# Patient Record
Sex: Male | Born: 1975 | Hispanic: No | Marital: Married | Smoking: Current every day smoker
Health system: Southern US, Community
[De-identification: ages and names within clinical notes are randomized; demographics above are authoritative.]

## PROBLEM LIST (undated history)

## (undated) DIAGNOSIS — Z789 Other specified health status: Secondary | ICD-10-CM

## (undated) HISTORY — PX: NO PAST SURGERIES: SHX2092

---

## 2000-02-28 ENCOUNTER — Emergency Department (HOSPITAL_COMMUNITY): Admission: EM | Admit: 2000-02-28 | Discharge: 2000-02-28 | Payer: Self-pay | Admitting: *Deleted

## 2001-05-20 ENCOUNTER — Encounter: Payer: Self-pay | Admitting: Emergency Medicine

## 2001-05-20 ENCOUNTER — Emergency Department (HOSPITAL_COMMUNITY): Admission: EM | Admit: 2001-05-20 | Discharge: 2001-05-20 | Payer: Self-pay | Admitting: Emergency Medicine

## 2009-06-10 ENCOUNTER — Emergency Department (HOSPITAL_COMMUNITY): Admission: EM | Admit: 2009-06-10 | Discharge: 2009-06-11 | Payer: Self-pay | Admitting: Emergency Medicine

## 2014-10-22 ENCOUNTER — Encounter (HOSPITAL_COMMUNITY): Payer: Self-pay | Admitting: *Deleted

## 2014-10-22 ENCOUNTER — Emergency Department (HOSPITAL_COMMUNITY)
Admission: EM | Admit: 2014-10-22 | Discharge: 2014-10-22 | Disposition: A | Discharge status: Home / Self Care | Payer: Self-pay | Attending: Emergency Medicine | Admitting: Emergency Medicine

## 2014-10-22 DIAGNOSIS — Z720 Tobacco use: Secondary | ICD-10-CM | POA: Insufficient documentation

## 2014-10-22 DIAGNOSIS — M79602 Pain in left arm: Secondary | ICD-10-CM | POA: Insufficient documentation

## 2014-10-22 LAB — CBG MONITORING, ED: Glucose-Capillary: 82 mg/dL (ref 65–99)

## 2014-10-22 MED ORDER — CYCLOBENZAPRINE HCL 5 MG PO TABS: 5.0000 mg | ORAL_TABLET | Freq: Two times a day (BID) | ORAL | Status: DC | PRN

## 2014-10-22 NOTE — Discharge Instructions (Signed)

## 2014-10-22 NOTE — ED Notes (Signed)
Pt is in stable condition upon d/c and ambulates from ED. 

## 2014-10-22 NOTE — ED Notes (Signed)
CBG 83, Tiffany PA informed

## 2014-10-22 NOTE — ED Provider Notes (Signed)
CSN: [id number]     Arrival date & time 10/22/14  [id number] History   First MD Initiated Contact with Patient 10/22/14 [phone]     Chief Complaint  Patient presents with  . Arm Pain     (Consider location/radiation/quality/duration/timing/severity/associated sxs/prior Treatment) HPI  PCP: No primary care provider on file. Blood pressure 119/99, pulse 72, temperature 97.9 F (36.6 C), temperature source Oral, height 5\' 11"  (1.803 m), weight 175 lb (79.379 kg), SpO2 100 %.  Thompson LouisCorrie Thompson is a 39 y.o.male without any significant PMH presents to the ER with complaints of left arm pain since Saturday. He denies having any injury or doing any physical activity that would cause the pain. He works at [employer] but says he is right handed. The pain does from the mid bicep down to his the middle of his hand, anterior ly and posteriorly.  He has not noticed any rash, wounds, swelling, weakness, or numbness. The pain is throbbing. He has not tried any medication at home for the pain. He describes the pain as very mild currently. Nothing makes it better or worse.    History reviewed. No pertinent past medical history. History reviewed. No pertinent past surgical history. Family History  Problem Relation Age of Onset  . Diabetes Mother   . Hypertension Mother   . Migraines Mother    History  Substance Use Topics  . Smoking status: Current Every Day Smoker -- 0.50 packs/day    Types: Cigarettes  . Smokeless tobacco: Never Used  . Alcohol Use: Yes     Comment: socially    Review of Systems  10 Systems reviewed and are negative for acute change except as noted in the HPI.    Allergies  Review of patient's allergies indicates no known allergies.  Home Medications   Prior to Admission medications   Medication Sig Start Date End Date Taking? Authorizing Provider  ibuprofen (ADVIL,MOTRIN) 400 MG tablet Take 400 mg by mouth every 6 (six) hours as needed for mild pain.   Yes Historical Provider, MD   cyclobenzaprine (FLEXERIL) 5 MG tablet Take 1 tablet (5 mg total) by mouth 2 (two) times daily as needed for muscle spasms. 10/22/14   Helmer Dull Neva SeatGreene, PA-C   BP 126/85 mmHg  Pulse 66  Temp(Src) 97.9 F (36.6 C) (Oral)  Ht 5\' 11"  (1.803 m)  Wt 175 lb (79.379 kg)  BMI 24.42 kg/m2  SpO2 100% Physical Exam  Constitutional: He appears well-developed and well-nourished. No distress.  HENT:  Head: Normocephalic and atraumatic.  Eyes: Pupils are equal, round, and reactive to light.  Neck: Normal range of motion. Neck supple.  Cardiovascular: Normal rate and regular rhythm.   Pulmonary/Chest: Effort normal.  Abdominal: Soft.  Musculoskeletal:       Left elbow: He exhibits normal range of motion, no swelling, no effusion, no deformity and no laceration. No tenderness found. No radial head, no medial epicondyle, no lateral epicondyle and no olecranon process tenderness noted.  No abnormality on physical exam. The patient has FROM active and passive without pain. He has a negative Tinel's and Phalen's sign. His radial pulses are symmetrical, he has intact sensation to all 5 fingers with CR < 2 seconds to all 5. NO neck or shoulder pain or abnormalities. No rash, skin is pink and moist.  Neurological: He is alert.  Skin: Skin is warm and dry.  Nursing note and vitals reviewed.   ED Course  Procedures (including critical care time) Labs Review Labs Reviewed  CBG  MONITORING, ED    Imaging Review No results found.   EKG Interpretation None     Thompson LouisSLOAN, Helix QM:[id number] 22-Oct-2014 10:23:16 [facility] ROUTINE RECORD Sinus rhythm  early repol pattern 2925mm/s 4210mm/mV 100Hz  8.0 SP2 CID: [id number] Referred by: Unconfirmed Vent. rate 59 BPM PR interval 170 ms QRS duration 83 ms QT/QTc 428/424 ms P-R-T axes 49 84 60  MDM   Final diagnoses:  Arm pain, diffuse, left   Pain appears to be peripheral neuropathy. CBG is WNL and EKG shows no significant abnormalities to  suggest cardiac etiology.  Will rx flexeril and recommend NSAIDs for pain control. Referral to Ortho, rest and ice shoulder/neck.  39 y.o.[name]'s evaluation in the Emergency Department is complete. It has been determined that no acute conditions requiring further emergency intervention are present at this time. The patient/guardian have been advised of the diagnosis and plan. We have discussed signs and symptoms that warrant return to the ED, such as changes or worsening in symptoms.  Vital signs are stable at discharge. Filed Vitals:   10/22/14 1000  BP: 126/85  Pulse: 66  Temp:     Patient/guardian has voiced understanding and agreed to follow-up with the PCP or specialist.     Marlon Peliffany Abir Craine, PA-C 10/22/14 1035  Toy CookeyMegan Docherty, MD 10/22/14 2053

## 2014-10-22 NOTE — ED Notes (Signed)
Pt arrives from home via POV. Pt states he began having left arm since Saturday 10/17/14 and states it hasn't gotten any worse or better. Pt states he is having pain from his left shoulder to his fingertips. Pt states nothing makes it worse or better, just a constant throbbing pain.

## 2016-03-03 ENCOUNTER — Emergency Department (HOSPITAL_COMMUNITY)
Admission: EM | Admit: 2016-03-03 | Discharge: 2016-03-03 | Disposition: A | Discharge status: Home / Self Care | Payer: PRIVATE HEALTH INSURANCE | Attending: Emergency Medicine | Admitting: Emergency Medicine

## 2016-03-03 ENCOUNTER — Encounter (HOSPITAL_COMMUNITY): Payer: Self-pay | Admitting: Emergency Medicine

## 2016-03-03 DIAGNOSIS — F17210 Nicotine dependence, cigarettes, uncomplicated: Secondary | ICD-10-CM | POA: Insufficient documentation

## 2016-03-03 DIAGNOSIS — K1379 Other lesions of oral mucosa: Secondary | ICD-10-CM | POA: Diagnosis not present

## 2016-03-03 DIAGNOSIS — J029 Acute pharyngitis, unspecified: Secondary | ICD-10-CM | POA: Diagnosis present

## 2016-03-03 LAB — RAPID STREP SCREEN (MED CTR MEBANE ONLY): Streptococcus, Group A Screen (Direct): NEGATIVE

## 2016-03-03 MED ORDER — NAPROXEN 500 MG PO TABS: 500.0000 mg | ORAL_TABLET | Freq: Two times a day (BID) | ORAL | 0 refills | Status: DC

## 2016-03-03 MED ORDER — ACETAMINOPHEN 325 MG PO TABS
650.0000 mg | ORAL_TABLET | Freq: Once | ORAL | Status: DC | PRN
Start: 2016-03-03 — End: 2016-03-03

## 2016-03-03 MED ORDER — PREDNISONE 10 MG PO TABS: 20.0000 mg | ORAL_TABLET | Freq: Two times a day (BID) | ORAL | 0 refills | Status: AC

## 2016-03-03 NOTE — ED Provider Notes (Signed)
[facility]EM[facility]ENCY DEPT Provider Note   CSN: [id number] Arrival date & time: 03/03/16  1229     History   Chief Complaint Chief Complaint  Patient presents with  . Sore Throat    HPI Louis Thompson is a 40 y.o. male.  HPI    Louis Thompson is a 40 y.o. male, patient with no pertinent past medical history, presenting to the ED with a "swollen" uvula first noticed this morning. Has not taken any medications for this issue. Denies throat pain, difficulty breathing, fever/chills, or any other complaints.  History reviewed. No pertinent past medical history.  There are no active problems to display for this patient.   History reviewed. No pertinent surgical history.     Home Medications    Prior to Admission medications   Medication Sig Start Date End Date Taking? Authorizing Provider  cyclobenzaprine (FLEXERIL) 5 MG tablet Take 1 tablet (5 mg total) by mouth 2 (two) times daily as needed for muscle spasms. 10/22/14   Tiffany Neva Seat, PA-C  ibuprofen (ADVIL,MOTRIN) 400 MG tablet Take 400 mg by mouth every 6 (six) hours as needed for mild pain.    Historical Provider, MD  naproxen (NAPROSYN) 500 MG tablet Take 1 tablet (500 mg total) by mouth 2 (two) times daily. 03/03/16   Ashante Snelling C Emara Lichter, PA-C  predniSONE (DELTASONE) 10 MG tablet Take 2 tablets (20 mg total) by mouth 2 (two) times daily with a meal. 03/03/16 03/08/16  Anselm Pancoast, PA-C    Family History Family History  Problem Relation Age of Onset  . Diabetes Mother   . Hypertension Mother   . Migraines Mother     Social History Social History  Substance Use Topics  . Smoking status: Current Every Day Smoker    Packs/day: 0.50    Types: Cigarettes  . Smokeless tobacco: Never Used  . Alcohol use Yes     Comment: socially     Allergies   Review of patient's allergies indicates no known allergies.   Review of Systems Review of Systems  Constitutional: Negative for chills and fever.  HENT: Negative for sore throat,  trouble swallowing and voice change.        "Swollen" uvula  Respiratory: Negative for cough and shortness of breath.   Gastrointestinal: Negative for nausea and vomiting.  All other systems reviewed and are negative.    Physical Exam Updated Vital Signs BP 125/87 (BP Location: Left Arm)   Pulse 98   Temp 98 F (36.7 C) (Oral)   Resp 18   Ht 5\' 11"  (1.803 m)   Wt 69.5 kg   SpO2 99%   BMI 21.37 kg/m   Physical Exam  Constitutional: He appears well-developed and well-nourished. No distress.  HENT:  Head: Normocephalic and atraumatic.  Patient has a large uvula, but it does not look inflamed. No difficulty breathing, readily handles oral secretions without difficulty, speaks in full sentences without hesitation, and is in no apparent distress.  Eyes: Conjunctivae are normal.  Neck: Neck supple.  Cardiovascular: Normal rate, regular rhythm, normal heart sounds and intact distal pulses.   Pulmonary/Chest: Effort normal and breath sounds normal. No respiratory distress.  Abdominal: There is no guarding.  Musculoskeletal: He exhibits no edema or tenderness.  Lymphadenopathy:    He has no cervical adenopathy.  Neurological: He is alert.  Skin: Skin is warm and dry. He is not diaphoretic.  Psychiatric: He has a normal mood and affect. His behavior is normal.  Nursing note and vitals reviewed.  ED Treatments / Results  Labs (all labs ordered are listed, but only abnormal results are displayed) Labs Reviewed  RAPID STREP SCREEN (NOT AT [facility])  CULTURE, GROUP A STREP [facility])    EKG  EKG Interpretation None       Radiology No results found.  Procedures Procedures (including critical care time)  Medications Ordered in ED Medications - No data to display   Initial Impression / Assessment and Plan / ED Course  I have reviewed the triage vital signs and the nursing notes.  Pertinent labs & imaging results that were available during my care of the patient were  reviewed by me and considered in my medical decision making (see chart for details).  Clinical Course    Patient presents with complaint of a "swollen" uvula first noticed this morning. Patient states that this does not bother him and he has no pain. Patient is well-appearing and in no apparent distress. May be possible that the patient just has a large uvula and has not noticed it before. Regardless, the patient was referred to ENT. Return precautions discussed.    Final Clinical Impressions(s) / ED Diagnoses   Final diagnoses:  Uvular swelling    New Prescriptions Discharge Medication List as of 03/03/2016  1:51 PM    START taking these medications   Details  naproxen (NAPROSYN) 500 MG tablet Take 1 tablet (500 mg total) by mouth 2 (two) times daily., Starting Fri 03/03/2016, Print    predniSONE (DELTASONE) 10 MG tablet Take 2 tablets (20 mg total) by mouth 2 (two) times daily with a meal., Starting Fri 03/03/2016, Until Wed 03/08/2016, Print         Anselm PancoastShawn C Lorilei Horan, PA-C 03/03/16 1524    Charlynne Panderavid Hsienta Yao, MD 03/03/16 1600

## 2016-03-03 NOTE — ED Triage Notes (Signed)
Patient states "the middle of my throat is hanging down further than I should".   Patient denies sore throat, just feels his uvula is a little bigger than normal.   Patient states no swallowing problems.  No trouble breathing.

## 2016-03-03 NOTE — Discharge Instructions (Signed)
Take naproxen or ibuprofen for the next three days, prednisone for the next 5 days. These are meant to reduce inflammation. Follow up with the Ear, Nose, and Throat specialist should symptoms continue. Return to the ED should symptoms worsen.

## 2016-03-05 LAB — CULTURE, GROUP A STREP (THRC)

## 2017-06-11 ENCOUNTER — Emergency Department (HOSPITAL_COMMUNITY)
Admission: EM | Admit: 2017-06-11 | Discharge: 2017-06-11 | Disposition: A | Discharge status: Home / Self Care | Payer: PRIVATE HEALTH INSURANCE | Attending: Emergency Medicine | Admitting: Emergency Medicine

## 2017-06-11 ENCOUNTER — Other Ambulatory Visit: Payer: Self-pay

## 2017-06-11 ENCOUNTER — Encounter (HOSPITAL_COMMUNITY): Payer: Self-pay

## 2017-06-11 DIAGNOSIS — F17210 Nicotine dependence, cigarettes, uncomplicated: Secondary | ICD-10-CM | POA: Diagnosis not present

## 2017-06-11 DIAGNOSIS — Z79899 Other long term (current) drug therapy: Secondary | ICD-10-CM | POA: Diagnosis not present

## 2017-06-11 DIAGNOSIS — R6889 Other general symptoms and signs: Secondary | ICD-10-CM

## 2017-06-11 DIAGNOSIS — R05 Cough: Secondary | ICD-10-CM | POA: Insufficient documentation

## 2017-06-11 MED ORDER — ONDANSETRON 4 MG PO TBDP
4.0000 mg | ORAL_TABLET | Freq: Once | ORAL | Status: AC | PRN
  Administered 2017-06-11: 4 mg via ORAL
  Filled 2017-06-11: qty 1

## 2017-06-11 MED ORDER — IBUPROFEN 600 MG PO TABS: 600.0000 mg | ORAL_TABLET | Freq: Four times a day (QID) | ORAL | 0 refills | Status: DC | PRN

## 2017-06-11 NOTE — ED Provider Notes (Signed)
[facility] EMERGENCY DEPARTMENT Provider Note   CSN: [id number] Arrival date & time: 06/11/17  7829     History   Chief Complaint Chief Complaint  Patient presents with  . Nausea  . Generalized Body Aches    HPI Louis Thompson is a 42 y.o. male.  The history is provided by the patient. No language interpreter was used.  Cough  This is a new problem. The problem occurs constantly. The problem has been gradually worsening. The cough is productive of sputum. There has been no fever. Pertinent negatives include no chest pain. He has tried nothing for the symptoms. The treatment provided no relief. He is not a smoker. His past medical history does not include pneumonia.    History reviewed. No pertinent past medical history.  There are no active problems to display for this patient.   History reviewed. No pertinent surgical history.     Home Medications    Prior to Admission medications   Medication Sig Start Date End Date Taking? Authorizing Provider  cyclobenzaprine (FLEXERIL) 5 MG tablet Take 1 tablet (5 mg total) by mouth 2 (two) times daily as needed for muscle spasms. 10/22/14   Marlon Pel, PA-C  ibuprofen (ADVIL,MOTRIN) 600 MG tablet Take 1 tablet (600 mg total) by mouth every 6 (six) hours as needed. 06/11/17   Elson Areas, PA-C  naproxen (NAPROSYN) 500 MG tablet Take 1 tablet (500 mg total) by mouth 2 (two) times daily. 03/03/16   Joy, Hillard Danker, PA-C    Family History Family History  Problem Relation Age of Onset  . Diabetes Mother   . Hypertension Mother   . Migraines Mother     Social History Social History   Tobacco Use  . Smoking status: Current Every Day Smoker    Packs/day: 0.50    Types: Cigarettes  . Smokeless tobacco: Never Used  Substance Use Topics  . Alcohol use: Yes    Comment: socially  . Drug use: No     Allergies   Patient has no known allergies.   Review of Systems Review of Systems  Respiratory:  Positive for cough.   Cardiovascular: Negative for chest pain.  All other systems reviewed and are negative.    Physical Exam Updated Vital Signs BP (!) 130/103 (BP Location: Right Arm)   Pulse 68   Temp 98.3 F (36.8 C) (Oral)   Resp 16   Ht 6' (1.829 m)   SpO2 97%   BMI 20.78 kg/m   Physical Exam  Constitutional: He appears well-developed and well-nourished.  HENT:  Head: Normocephalic and atraumatic.  Eyes: Conjunctivae are normal.  Neck: Neck supple.  Cardiovascular: Normal rate and regular rhythm.  No murmur heard. Pulmonary/Chest: Effort normal and breath sounds normal. No respiratory distress.  Abdominal: Soft. There is no tenderness.  Musculoskeletal: He exhibits no edema.  Neurological: He is alert.  Skin: Skin is warm and dry.  Psychiatric: He has a normal mood and affect.  Nursing note and vitals reviewed.    ED Treatments / Results  Labs (all labs ordered are listed, but only abnormal results are displayed) Labs Reviewed - No data to display  EKG  EKG Interpretation None       Radiology No results found.  Procedures Procedures (including critical care time)  Medications Ordered in ED Medications  ondansetron (ZOFRAN-ODT) disintegrating tablet 4 mg (4 mg Oral Given 06/11/17 1032)     Initial Impression / Assessment and Plan / ED Course  I have reviewed the triage vital signs and the nursing notes.  Pertinent labs & imaging results that were available during my care of the patient were reviewed by me and considered in my medical decision making (see chart for details).     Pt feels better after medications.   Final Clinical Impressions(s) / ED Diagnoses   Final diagnoses:  Flu-like symptoms    ED Discharge Orders        Ordered    ibuprofen (ADVIL,MOTRIN) 600 MG tablet  Every 6 hours PRN     06/11/17 1256    An After Visit Summary was printed and given to the patient.    Osie CheeksSofia, Leslie K, PA-C 06/11/17 1703    Benjiman CorePickering,  Nathan, MD 06/11/17 2033

## 2017-06-11 NOTE — ED Triage Notes (Signed)
Pt. Reports having chills, body aches, nausea , nasal congestion and cough for 3 days.

## 2017-10-08 ENCOUNTER — Other Ambulatory Visit: Payer: Self-pay

## 2017-10-08 ENCOUNTER — Emergency Department (HOSPITAL_COMMUNITY)
Admission: EM | Admit: 2017-10-08 | Discharge: 2017-10-08 | Disposition: A | Discharge status: Home / Self Care | Payer: PRIVATE HEALTH INSURANCE | Attending: Emergency Medicine | Admitting: Emergency Medicine

## 2017-10-08 ENCOUNTER — Encounter (HOSPITAL_COMMUNITY): Payer: Self-pay | Admitting: *Deleted

## 2017-10-08 DIAGNOSIS — Y939 Activity, unspecified: Secondary | ICD-10-CM | POA: Insufficient documentation

## 2017-10-08 DIAGNOSIS — Y929 Unspecified place or not applicable: Secondary | ICD-10-CM | POA: Insufficient documentation

## 2017-10-08 DIAGNOSIS — X58XXXA Exposure to other specified factors, initial encounter: Secondary | ICD-10-CM | POA: Insufficient documentation

## 2017-10-08 DIAGNOSIS — S29012A Strain of muscle and tendon of back wall of thorax, initial encounter: Secondary | ICD-10-CM

## 2017-10-08 DIAGNOSIS — T148XXA Other injury of unspecified body region, initial encounter: Secondary | ICD-10-CM

## 2017-10-08 DIAGNOSIS — Y999 Unspecified external cause status: Secondary | ICD-10-CM | POA: Insufficient documentation

## 2017-10-08 DIAGNOSIS — F17210 Nicotine dependence, cigarettes, uncomplicated: Secondary | ICD-10-CM | POA: Insufficient documentation

## 2017-10-08 MED ORDER — LIDOCAINE 5 % EX PTCH: 1.0000 | MEDICATED_PATCH | CUTANEOUS | 0 refills | Status: DC

## 2017-10-08 MED ORDER — NAPROXEN 500 MG PO TABS: 500.0000 mg | ORAL_TABLET | Freq: Two times a day (BID) | ORAL | 0 refills | Status: DC

## 2017-10-08 MED ORDER — CYCLOBENZAPRINE HCL 10 MG PO TABS: 10.0000 mg | ORAL_TABLET | Freq: Two times a day (BID) | ORAL | 0 refills | Status: DC | PRN

## 2017-10-08 NOTE — ED Provider Notes (Signed)
[facility] EMERGENCY DEPARTMENT Provider Note   CSN: [id number] Arrival date & time: 10/08/17  1335     History   Chief Complaint Chief Complaint  Patient presents with  . Back Pain    HPI Louis Thompson is a 42 y.o. male who presents to ED for evaluation of 2-day history of left-sided mid back pain.  States that the pain began after waking up on Saturday morning.  He has tried 1 dose of Tylenol with no improvement in his symptoms.  Cannot recall any inciting event that may have triggered the symptoms.  Reports history of similar symptoms in the past which resolved with supportive measures.  Denies any injuries or falls, prior back surgeries, history of cancer, history of IV drug use, fevers, numbness in legs, dysuria, loss of bowel or bladder function, trouble breathing, vomiting or abdominal pain.  HPI  History reviewed. No pertinent past medical history.  There are no active problems to display for this patient.   History reviewed. No pertinent surgical history.      Home Medications    Prior to Admission medications   Medication Sig Start Date End Date Taking? Authorizing Provider  cyclobenzaprine (FLEXERIL) 10 MG tablet Take 1 tablet (10 mg total) by mouth 2 (two) times daily as needed for muscle spasms. 10/08/17   Kemonie Cutillo, PA-C  ibuprofen (ADVIL,MOTRIN) 600 MG tablet Take 1 tablet (600 mg total) by mouth every 6 (six) hours as needed. 06/11/17   Elson Areas, PA-C  lidocaine (LIDODERM) 5 % Place 1 patch onto the skin daily. Remove & Discard patch within 12 hours or as directed by MD 10/08/17   Dietrich Pates, PA-C  naproxen (NAPROSYN) 500 MG tablet Take 1 tablet (500 mg total) by mouth 2 (two) times daily. 10/08/17   Dietrich Pates, PA-C    Family History Family History  Problem Relation Age of Onset  . Diabetes Mother   . Hypertension Mother   . Migraines Mother     Social History Social History   Tobacco Use  . Smoking status: Current Every Day  Smoker    Packs/day: 0.50    Types: Cigarettes  . Smokeless tobacco: Never Used  Substance Use Topics  . Alcohol use: Yes    Comment: socially  . Drug use: Yes    Types: Marijuana     Allergies   Patient has no known allergies.   Review of Systems Review of Systems  Constitutional: Negative for chills and fever.  Respiratory: Negative for shortness of breath.   Cardiovascular: Negative for chest pain.  Gastrointestinal: Negative for vomiting.  Genitourinary: Negative for dysuria and flank pain.  Musculoskeletal: Positive for back pain and myalgias. Negative for arthralgias, gait problem, joint swelling, neck pain and neck stiffness.  Skin: Negative for wound.  Neurological: Negative for weakness and numbness.     Physical Exam Updated Vital Signs BP 140/85 (BP Location: Right Arm)   Pulse (!) 57   Temp 98.2 F (36.8 C) (Oral)   Resp 16   Ht  (1.803 m)   Wt 79.8 kg (176 lb)   SpO2 100%   BMI 24.55 kg/m   Physical Exam  Constitutional: He appears well-developed and well-nourished. No distress.  Nontoxic-appearing and in no acute distress.  Ambulatory with normal gait.  HENT:  Head: Normocephalic and atraumatic.  Eyes: Conjunctivae and EOM are normal. No scleral icterus.  Neck: Normal range of motion.  Pulmonary/Chest: Effort normal. No respiratory distress.  Musculoskeletal:  Back:  No midline spinal tenderness present in lumbar, thoracic or cervical spine. No step-off palpated. No visible bruising, edema or temperature change noted. No objective signs of numbness present. No saddle anesthesia. 2+ DP pulses bilaterally. Sensation intact to light touch. Strength 5/5 in bilateral lower extremities.  Neurological: He is alert.  Skin: No rash noted. He is not diaphoretic.  Psychiatric: He has a normal mood and affect.  Nursing note and vitals reviewed.    ED Treatments / Results  Labs (all labs ordered are listed, but only abnormal results are  displayed) Labs Reviewed - No data to display  EKG None  Radiology No results found.  Procedures Procedures (including critical care time)  Medications Ordered in ED Medications - No data to display   Initial Impression / Assessment and Plan / ED Course  I have reviewed the triage vital signs and the nursing notes.  Pertinent labs & imaging results that were available during my care of the patient were reviewed by me and considered in my medical decision making (see chart for details).     Patient presents to ED for evaluation of right-sided mid back pain for the past 2 days.  Denies any red flags for back pain including prior back surgeries, history of cancer, history of IV drug use, fevers, numbness in legs, loss of bowel or bladder function, injuries or falls.  Physical exam he does have paraspinal musculature tenderness to palpation on the right side.  No midline tenderness noted.  Doubt cauda equina, dissection, other surgical or emergent cause of symptoms.  He is ambulating with a normal gait and there are no deficits on his neurological examination noted.  Suspect that his symptoms are due to a muscle strain.  Advised to use supportive measures prescribed to him and to follow-up with PCP for further evaluation if symptoms persist.  Advised to return to ED for any severe worsening symptoms.  Portions of this note were generated with Scientist, clinical (histocompatibility and immunogenetics). Dictation errors may occur despite best attempts at proofreading.   Final Clinical Impressions(s) / ED Diagnoses   Final diagnoses:  Muscle strain  Strain of mid-back, initial encounter    ED Discharge Orders        Ordered    cyclobenzaprine (FLEXERIL) 10 MG tablet  2 times daily PRN     10/08/17 1711    naproxen (NAPROSYN) 500 MG tablet  2 times daily     10/08/17 1711    lidocaine (LIDODERM) 5 %  Every 24 hours     10/08/17 1711       Dietrich Pates, PA-C 10/08/17 1715    Jacalyn Lefevre, MD 10/08/17  1907

## 2017-10-08 NOTE — ED Notes (Signed)
Patient verbalizes understanding of discharge instructions. Opportunity for questioning and answers were provided. Armband removed by staff, pt discharged from ED ambulatory.   

## 2017-10-08 NOTE — ED Triage Notes (Signed)
Pt c/o L upper back pain onset x 3 days, denies injury, denies cough, pt hx of chronic back pain, pt 10/10 pain, denies relief with OTC meds, A&O x4

## 2018-12-14 ENCOUNTER — Other Ambulatory Visit: Payer: Self-pay | Admitting: *Deleted

## 2018-12-14 DIAGNOSIS — Z20822 Contact with and (suspected) exposure to covid-19: Secondary | ICD-10-CM

## 2018-12-20 LAB — NOVEL CORONAVIRUS, NAA: SARS-CoV-2, NAA: NOT DETECTED

## 2019-03-24 ENCOUNTER — Other Ambulatory Visit: Payer: Self-pay

## 2019-03-24 ENCOUNTER — Ambulatory Visit: Payer: Self-pay | Admitting: Family Medicine

## 2019-03-25 ENCOUNTER — Ambulatory Visit (INDEPENDENT_AMBULATORY_CARE_PROVIDER_SITE_OTHER): Payer: Self-pay | Admitting: Family Medicine

## 2019-03-25 DIAGNOSIS — Z8249 Family history of ischemic heart disease and other diseases of the circulatory system: Secondary | ICD-10-CM

## 2019-03-25 DIAGNOSIS — Z7689 Persons encountering health services in other specified circumstances: Secondary | ICD-10-CM | POA: Insufficient documentation

## 2019-03-25 NOTE — Progress Notes (Signed)
   Subjective:    Patient ID: Louis Thompson, male    DOB: [birth year], 43 y.o.   MRN: [id number]   CC: New Patient Encounter  HPI: Patient is a very pleasant 43 year old gentleman presenting to the [facility] to establish care.  He has no questions, concerns, or complaints today.  He denies having any fevers, headaches, vision changes, nausea, vomiting, abdominal pain, shortness of breath, diarrhea, constipation, rashes, or joint pain.  Past medical history: History of back spasms, used to be seen at the [facility] emergency department for his healthcare; additionally, patient has tattoos that were done and he presents home and not in a studio, he states they were done many years ago  Family history: Patient reports that his mother passed away at age 16 from an abdominal aortic aneurysm, she also had a history of hypertension. Surgical history: None Social history  - smoking status reviewed: Patient smoking marijuana 2-3 times weekly, patient also consumes 2-3 alcoholic beverages 2-3 times weekly (prefers beer and liquor), smokes cigarettes, about half pack daily -Patient is currently unemployed, was previously working at [employer] -Patiently is currently sexually active  Review of Systems -see HPI   Objective:  BP 118/80   Pulse 78   Temp 98.3 F (36.8 C) (Oral)   Ht 5\' 9"  (1.753 m)   Wt 160 lb (72.6 kg)   SpO2 99%   BMI 23.63 kg/m  Vitals and nursing note reviewed  General: well nourished, in no acute distress, pleasant patient HEENT: normocephalic, patient has long braids, TM's visualized bilaterally, minimal cerumen, no nasal discharge, moist mucous membranes, good dentition with ~3 filled cavities Cardiac: RRR, clear S1 and S2, no murmurs appreciated Respiratory: CTA bilaterally, comfortable work of breathing Abdomen: soft, no masses organomegaly, normal bowel sounds present Extremities: no edema or cyanosis, tattoos appreciated to bilateral upper extremities, 2+  radial pulses bilaterally Skin: warm and dry, no rashes noted Neuro: alert and oriented, no focal deficits appreciated, cranial nerves II-XII intact, 5/5 strength exhibited in bilateral upper and lower extremities, normal coordination   Assessment & Plan:   Establishing care with new doctor, encounter for Patient presenting to meet new physician. -Respectfully declined flu shot, HCV and HIV testing -Patient will think about obtaining labs and follow up in the future  Family history of abdominal aortic aneurysm Patient's mother passed away at age 9 from AAA, also had Hx of HTN -Patient is a male who smokes and has a family Hx of AAA - USPSTF recommends AAA screening via ultrasound between ages of 35-75 -Patient encouraged to reduce or stop smoking    Return if symptoms worsen or fail to improve.   Dr. Milus Banister [facility], PGY-2

## 2019-03-25 NOTE — Assessment & Plan Note (Signed)
Patient presenting to meet new physician. -Respectfully declined flu shot, HCV and HIV testing -Patient will think about obtaining labs and follow up in the future

## 2019-03-25 NOTE — Assessment & Plan Note (Signed)
Patient's mother passed away at age 41 from AAA, also had Hx of HTN -Patient is a male who smokes and has a family Hx of AAA - [facility]PSTF recommends AAA screening via ultrasound between ages of 62-75 -Patient encouraged to reduce or stop smoking

## 2020-11-22 ENCOUNTER — Other Ambulatory Visit: Payer: Self-pay

## 2020-11-22 ENCOUNTER — Emergency Department (HOSPITAL_COMMUNITY)
Admission: EM | Admit: 2020-11-22 | Discharge: 2020-11-22 | Disposition: A | Discharge status: Home / Self Care | Payer: Self-pay | Attending: Emergency Medicine | Admitting: Emergency Medicine

## 2020-11-22 ENCOUNTER — Encounter (HOSPITAL_COMMUNITY): Payer: Self-pay | Admitting: *Deleted

## 2020-11-22 ENCOUNTER — Emergency Department (HOSPITAL_COMMUNITY): Payer: Self-pay

## 2020-11-22 DIAGNOSIS — W108XXA Fall (on) (from) other stairs and steps, initial encounter: Secondary | ICD-10-CM | POA: Insufficient documentation

## 2020-11-22 DIAGNOSIS — M25562 Pain in left knee: Secondary | ICD-10-CM | POA: Insufficient documentation

## 2020-11-22 DIAGNOSIS — M25561 Pain in right knee: Secondary | ICD-10-CM | POA: Insufficient documentation

## 2020-11-22 DIAGNOSIS — F17210 Nicotine dependence, cigarettes, uncomplicated: Secondary | ICD-10-CM | POA: Insufficient documentation

## 2020-11-22 MED ORDER — ACETAMINOPHEN 500 MG PO TABS: 1000.0000 mg | ORAL_TABLET | Freq: Four times a day (QID) | ORAL | 0 refills | Status: AC

## 2020-11-22 MED ORDER — KETOROLAC TROMETHAMINE 15 MG/ML IJ SOLN
15.0000 mg | Freq: Once | INTRAMUSCULAR | Status: AC
  Administered 2020-11-22: 15 mg via INTRAMUSCULAR
  Filled 2020-11-22: qty 1

## 2020-11-22 MED ORDER — IBUPROFEN 800 MG PO TABS: 800.0000 mg | ORAL_TABLET | Freq: Three times a day (TID) | ORAL | 0 refills | Status: AC

## 2020-11-22 NOTE — ED Triage Notes (Signed)
Pt reports falling down a few steps recently and has bilateral knee pain since. Ambulatory at triage.

## 2020-11-22 NOTE — ED Provider Notes (Signed)
[facility] EMERGENCY DEPARTMENT Provider Note   CSN: [id number] Arrival date & time: 11/22/20  1051     History Chief Complaint  Patient presents with   Fall   Knee Pain    Louis Thompson is a 45 y.o. male.  The history is provided by the patient and medical records.  Fall This is a new problem. The current episode started yesterday. The problem occurs constantly. The problem has not changed since onset.Pertinent negatives include no chest pain, no abdominal pain, no headaches and no shortness of breath. The symptoms are aggravated by walking and standing. Nothing (Nothing tried) relieves the symptoms. He has tried nothing for the symptoms.  Knee Pain Location:  Knee Time since incident:  24 hours Injury: yes   Mechanism of injury: fall   Fall:    Fall occurred: Fell onto both knees while walking up the staris. Struck bilateral knees on flat ground.   Height of fall:  Ground level   Impact surface:  Hard floor   Point of impact:  Knees   Entrapped after fall: no   Knee location:  L knee and R knee Pain details:    Quality:  Aching   Radiates to:  Does not radiate   Severity:  Mild   Onset quality:  Sudden   Duration:  1 day   Timing:  Constant   Progression:  Unchanged Chronicity:  New Dislocation: no   Foreign body present:  No foreign bodies Tetanus status:  Unknown Prior injury to area:  No Relieved by:  None tried Worsened by:  Activity, bearing weight and exercise Associated symptoms: no back pain and no fever       History reviewed. No pertinent past medical history.  Patient Active Problem List   Diagnosis Date Noted   Establishing care with new doctor, encounter for 03/25/2019   Family history of abdominal aortic aneurysm 03/25/2019    History reviewed. No pertinent surgical history.     Family History  Problem Relation Age of Onset   Diabetes Mother    Hypertension Mother    Migraines Mother     Social History   Tobacco  Use   Smoking status: Every Day    Packs/day: 0.50    Pack years: 0.00    Types: Cigarettes   Smokeless tobacco: Never  Substance Use Topics   Alcohol use: Yes    Comment: socially   Drug use: Yes    Types: Marijuana    Home Medications Prior to Admission medications   Medication Sig Start Date End Date Taking? Authorizing Provider  acetaminophen (TYLENOL) 500 MG tablet Take 2 tablets (1,000 mg total) by mouth every 6 (six) hours for 5 days. 11/22/20 11/27/20 Yes Ardeen Fillers, DO  ibuprofen (ADVIL) 800 MG tablet Take 1 tablet (800 mg total) by mouth every 8 (eight) hours for 5 days. 11/22/20 11/27/20 Yes Ardeen Fillers, DO    Allergies    Patient has no known allergies.  Review of Systems   Review of Systems  Constitutional:  Negative for chills and fever.  HENT:  Negative for ear pain and sore throat.   Eyes:  Negative for pain and visual disturbance.  Respiratory:  Negative for cough and shortness of breath.   Cardiovascular:  Negative for chest pain and palpitations.  Gastrointestinal:  Negative for abdominal pain and vomiting.  Genitourinary:  Negative for dysuria and hematuria.  Musculoskeletal:  Positive for arthralgias. Negative for back pain.  Skin:  Negative for  color change and rash.  Neurological:  Negative for seizures, syncope and headaches.  All other systems reviewed and are negative.  Physical Exam Updated Vital Signs BP (!) 141/98 (BP Location: Right Arm)   Pulse 86   Temp 99 F (37.2 C)   Resp 17   SpO2 96%   Physical Exam Vitals and nursing note reviewed.  Constitutional:      Appearance: He is well-developed.  HENT:     Head: Normocephalic and atraumatic.  Eyes:     Conjunctiva/sclera: Conjunctivae normal.  Cardiovascular:     Rate and Rhythm: Normal rate and regular rhythm.     Heart sounds: No murmur heard. Pulmonary:     Effort: Pulmonary effort is normal. No respiratory distress.     Breath sounds: Normal breath sounds.   Abdominal:     Palpations: Abdomen is soft.     Tenderness: There is no abdominal tenderness.  Musculoskeletal:     Cervical back: Neck supple.     Right knee: No bony tenderness. Tenderness present over the medial joint line.     Left knee: No bony tenderness. Tenderness present over the medial joint line (mild).     Comments:  No bony TTP of either knee. Particularly, no tibial plateau tenderness. No overlying skin changes, wounds, rashes or warmth. Full active ROM of bilateral knees. Able to bear weight on both lower extremities.  Skin:    General: Skin is warm and dry.  Neurological:     Mental Status: He is alert.    ED Results / Procedures / Treatments   Labs (all labs ordered are listed, but only abnormal results are displayed) Labs Reviewed - No data to display  EKG None  Radiology DG Knee Complete 4 Views Left  Result Date: 11/22/2020 CLINICAL DATA:  LEFT knee pain after falling down the stairs EXAM: LEFT KNEE - COMPLETE 4+ VIEW COMPARISON:  None FINDINGS: Osseous mineralization normal. Joint spaces preserved. Lateral plateau is slightly ill-defined on AP view, though not definitely abnormal on remaining views. No definite fracture, dislocation, or bone destruction. No joint effusion. IMPRESSION: Lateral tibial plateau is less well-defined on the AP view, though not seen on remaining views and no joint effusion is identified. No definite acute fracture or dislocation seen. If patient has persistent pain/tenderness associated with the lateral tibial plateau, consider CT. Electronically Signed   By: Ulyses Southward M.D.   On: 11/22/2020 14:13   DG Knee Complete 4 Views Right  Result Date: 11/22/2020 CLINICAL DATA:  RIGHT knee pain after fell downstairs EXAM: RIGHT KNEE - COMPLETE 4+ VIEW COMPARISON:  None FINDINGS: Osseous mineralization normal. Joint spaces preserved. No acute fracture, dislocation, or bone destruction. Non fused ossicle at tibial tubercle. No joint effusion.  IMPRESSION: No acute osseous abnormalities. Electronically Signed   By: Ulyses Southward M.D.   On: 11/22/2020 14:09    Procedures Procedures   Medications Ordered in ED Medications  ketorolac (TORADOL) 15 MG/ML injection 15 mg (has no administration in time range)    ED Course  I have reviewed the triage vital signs and the nursing notes.  Pertinent labs & imaging results that were available during my care of the patient were reviewed by me and considered in my medical decision making (see chart for details).    MDM Rules/Calculators/A&P                          Impression is musculoskeletal pain in the  setting of bilateral traumatic knee injuries that occurred yesterday. No open wounds that require treatment. Ligamentous exam reassuring.  Doubt ligament tear or injury at this time. Likely has a meniscal tear of the right medial knee though likely mild. No evidence of fractures or dislocation on plain films.  Plain films of the left knee on radiology report noted difficult differentiating the left tibial plateau however he has no tenderness in this area whatsoever.  Plan will be to treat symptomatically with scheduled NSAIDs, RICE. I gave him information to obtain a primary care doctor for follow-up.  Final Clinical Impression(s) / ED Diagnoses Final diagnoses:  Acute pain of both knees    Rx / DC Orders ED Discharge Orders          Ordered    ibuprofen (ADVIL) 800 MG tablet  Every 8 hours        11/22/20 1708    acetaminophen (TYLENOL) 500 MG tablet  Every 6 hours        11/22/20 1708             Ardeen Fillers, DO 11/22/20 1712    Blane Ohara, MD 11/22/20 2306

## 2020-11-22 NOTE — ED Provider Notes (Signed)
Emergency Medicine Provider Triage Evaluation Note  Louis Thompson , a 45 y.o. male  was evaluated in triage.  Pt complains of pain in bilateral knees.  Louis Thompson down a few steps yesterday.  Nonsyncopal.  Denies any other injuries.  Pain is worse in the bilateral medial aspect.  Pain is worse in the right knee than the left.  Review of Systems  Positive: Knee pain Negative: Head injury  Physical Exam  BP (!) 141/98 (BP Location: Right Arm)   Pulse 86   Temp 99 F (37.2 C)   Resp 17   SpO2 96%  Gen:   Awake, no distress   Resp:  Normal effort  MSK:   Moves extremities without difficulty  Other:  Louis Thompson to palpation over bilateral knees more over the medial aspect.  Able to move both legs and ambulate.  Medical Decision Making  Medically screening exam initiated at 1:22 PM.  Appropriate orders placed.  Louis Thompson was informed that the remainder of the evaluation will be completed by another provider, this initial triage assessment does not replace that evaluation, and the importance of remaining in the ED until their evaluation is complete.     Cristina Gong, PA-C 11/22/20 1323    Arby Barrette, MD 12/01/20 [phone]

## 2020-11-22 NOTE — ED Notes (Signed)
RN reviewed discharge instructions. Follow up and prescriptions reviewed, pt had no further questions. 

## 2021-01-22 ENCOUNTER — Encounter (HOSPITAL_COMMUNITY): Payer: Self-pay | Admitting: Emergency Medicine

## 2021-01-22 ENCOUNTER — Emergency Department (HOSPITAL_COMMUNITY)
Admission: EM | Admit: 2021-01-22 | Discharge: 2021-01-22 | Disposition: A | Discharge status: Home / Self Care | Payer: Self-pay | Attending: Emergency Medicine | Admitting: Emergency Medicine

## 2021-01-22 ENCOUNTER — Emergency Department (HOSPITAL_COMMUNITY): Payer: Self-pay

## 2021-01-22 ENCOUNTER — Other Ambulatory Visit: Payer: Self-pay

## 2021-01-22 DIAGNOSIS — R1084 Generalized abdominal pain: Secondary | ICD-10-CM | POA: Insufficient documentation

## 2021-01-22 DIAGNOSIS — F17210 Nicotine dependence, cigarettes, uncomplicated: Secondary | ICD-10-CM | POA: Insufficient documentation

## 2021-01-22 DIAGNOSIS — R509 Fever, unspecified: Secondary | ICD-10-CM | POA: Insufficient documentation

## 2021-01-22 DIAGNOSIS — R112 Nausea with vomiting, unspecified: Secondary | ICD-10-CM | POA: Insufficient documentation

## 2021-01-22 DIAGNOSIS — R197 Diarrhea, unspecified: Secondary | ICD-10-CM | POA: Insufficient documentation

## 2021-01-22 LAB — CBC
HCT: 40.1 % (ref 39.0–52.0)
Hemoglobin: 13.3 g/dL (ref 13.0–17.0)
MCH: 31 pg (ref 26.0–34.0)
MCHC: 33.2 g/dL (ref 30.0–36.0)
MCV: 93.5 fL (ref 80.0–100.0)
Platelets: 413 10*3/uL — ABNORMAL HIGH (ref 150–400)
RBC: 4.29 MIL/uL (ref 4.22–5.81)
RDW: 13.4 % (ref 11.5–15.5)
WBC: 5.3 10*3/uL (ref 4.0–10.5)
nRBC: 0 % (ref 0.0–0.2)

## 2021-01-22 LAB — URINALYSIS, ROUTINE W REFLEX MICROSCOPIC
Bacteria, UA: NONE SEEN
Bilirubin Urine: NEGATIVE
Glucose, UA: NEGATIVE mg/dL
Hgb urine dipstick: NEGATIVE
Ketones, ur: NEGATIVE mg/dL
Nitrite: NEGATIVE
Protein, ur: NEGATIVE mg/dL
Specific Gravity, Urine: 1.027 (ref 1.005–1.030)
pH: 5 (ref 5.0–8.0)

## 2021-01-22 LAB — COMPREHENSIVE METABOLIC PANEL
ALT: 23 U/L (ref 0–44)
AST: 35 U/L (ref 15–41)
Albumin: 4.5 g/dL (ref 3.5–5.0)
Alkaline Phosphatase: 41 U/L (ref 38–126)
Anion gap: 7 (ref 5–15)
BUN: 10 mg/dL (ref 6–20)
CO2: 27 mmol/L (ref 22–32)
Calcium: 9.3 mg/dL (ref 8.9–10.3)
Chloride: 103 mmol/L (ref 98–111)
Creatinine, Ser: 1.14 mg/dL (ref 0.61–1.24)
GFR, Estimated: >60 mL/min (ref >60)
Glucose, Bld: 107 mg/dL — ABNORMAL HIGH (ref 70–99)
Potassium: 4.1 mmol/L (ref 3.5–5.1)
Sodium: 137 mmol/L (ref 135–145)
Total Bilirubin: 1.2 mg/dL (ref 0.3–1.2)
Total Protein: 6.8 g/dL (ref 6.5–8.1)

## 2021-01-22 LAB — LIPASE, BLOOD: Lipase: 31 U/L (ref 11–51)

## 2021-01-22 MED ORDER — ONDANSETRON 4 MG PO TBDP: 4.0000 mg | ORAL_TABLET | Freq: Three times a day (TID) | ORAL | 0 refills | Status: DC | PRN

## 2021-01-22 MED ORDER — MORPHINE SULFATE (PF) 4 MG/ML IV SOLN
4.0000 mg | Freq: Once | INTRAVENOUS | Status: AC
  Administered 2021-01-22: 4 mg via INTRAVENOUS
  Filled 2021-01-22: qty 1

## 2021-01-22 MED ORDER — IOHEXOL 300 MG/ML  SOLN
100.0000 mL | Freq: Once | INTRAMUSCULAR | Status: AC | PRN
  Administered 2021-01-22: 100 mL via INTRAVENOUS

## 2021-01-22 MED ORDER — ONDANSETRON HCL 4 MG/2ML IJ SOLN
4.0000 mg | Freq: Once | INTRAMUSCULAR | Status: AC
  Administered 2021-01-22: 4 mg via INTRAVENOUS
  Filled 2021-01-22: qty 2

## 2021-01-22 MED ORDER — SODIUM CHLORIDE 0.9 % IV BOLUS
1000.0000 mL | Freq: Once | INTRAVENOUS | Status: AC
  Administered 2021-01-22: 1000 mL via INTRAVENOUS

## 2021-01-22 NOTE — Discharge Instructions (Signed)
Your work-up today is reassuring.  Take Zofran at home as needed as prescribed for nausea and vomiting.  Recommend clear liquid diet, advance slowly to bland foods as tolerated.  Return to the ER for new or worsening symptoms.  Recheck with your primary care provider as needed.

## 2021-01-22 NOTE — ED Provider Notes (Signed)
[facility] EMERGENCY DEPARTMENT Provider Note   CSN: [id number] Arrival date & time: 01/22/21  1948     History Chief Complaint  Patient presents with   Abdominal Pain    Louis Thompson is a 45 y.o. male.  45 year old male presents with complaint of generalized abdominal pain with vomiting and diarrhea x2 days.  Reports feeling feverish with chills at home.  Emesis is stools are nonbloody, stools described as loose.  No recent travel, no recent biotics, no sick contacts.  Denies cough, congestion, COVID contacts.  No prior abdominal surgeries.  No similar symptoms previously.  No other complaints or concerns.      History reviewed. No pertinent past medical history.  Patient Active Problem List   Diagnosis Date Noted   Establishing care with new doctor, encounter for 03/25/2019   Family history of abdominal aortic aneurysm 03/25/2019    History reviewed. No pertinent surgical history.     Family History  Problem Relation Age of Onset   Diabetes Mother    Hypertension Mother    Migraines Mother     Social History   Tobacco Use   Smoking status: Every Day    Packs/day: 0.50    Types: Cigarettes   Smokeless tobacco: Never  Substance Use Topics   Alcohol use: Yes    Comment: socially   Drug use: Yes    Types: Marijuana    Home Medications Prior to Admission medications   Medication Sig Start Date End Date Taking? Authorizing Provider  ondansetron (ZOFRAN ODT) 4 MG disintegrating tablet Take 1 tablet (4 mg total) by mouth every 8 (eight) hours as needed for nausea or vomiting. 01/22/21  Yes Jeannie Fend, PA-C    Allergies    Patient has no known allergies.  Review of Systems   Review of Systems  Constitutional:  Positive for chills and fever.  HENT:  Negative for congestion.   Respiratory:  Negative for cough and shortness of breath.   Cardiovascular:  Negative for chest pain.  Gastrointestinal:  Positive for abdominal pain, diarrhea,  nausea and vomiting. Negative for blood in stool and constipation.  Genitourinary:  Negative for difficulty urinating and dysuria.  Musculoskeletal:  Negative for arthralgias and myalgias.  Skin:  Negative for rash and wound.  Allergic/Immunologic: Negative for immunocompromised state.  Neurological:  Negative for weakness.  Hematological:  Negative for adenopathy.  Psychiatric/Behavioral:  Negative for confusion.   All other systems reviewed and are negative.  Physical Exam Updated Vital Signs BP 123/84   Pulse 66   Temp 98.9 F (37.2 C) (Oral)   Resp 15   Ht 5\' 11"  (1.803 m)   Wt 80 kg   SpO2 97%   BMI 24.60 kg/m   Physical Exam Vitals and nursing note reviewed.  Constitutional:      General: He is not in acute distress.    Appearance: He is well-developed. He is not diaphoretic.  HENT:     Head: Normocephalic and atraumatic.  Cardiovascular:     Rate and Rhythm: Normal rate and regular rhythm.     Heart sounds: Normal heart sounds.  Pulmonary:     Effort: Pulmonary effort is normal.     Breath sounds: Normal breath sounds.  Abdominal:     Palpations: Abdomen is soft.     Tenderness: There is generalized abdominal tenderness. There is no guarding or rebound.  Skin:    General: Skin is warm and dry.     Findings: No  erythema or rash.  Neurological:     Mental Status: He is alert and oriented to person, place, and time.  Psychiatric:        Behavior: Behavior normal.    ED Results / Procedures / Treatments   Labs (all labs ordered are listed, but only abnormal results are displayed) Labs Reviewed  COMPREHENSIVE METABOLIC PANEL - Abnormal; Notable for the following components:      Result Value   Glucose, Bld 107 (*)    All other components within normal limits  CBC - Abnormal; Notable for the following components:   Platelets 413 (*)    All other components within normal limits  URINALYSIS, ROUTINE W REFLEX MICROSCOPIC - Abnormal; Notable for the following  components:   APPearance HAZY (*)    Leukocytes,Ua TRACE (*)    All other components within normal limits  LIPASE, BLOOD    EKG None  Radiology CT Abdomen Pelvis W Contrast  Result Date: 01/22/2021 CLINICAL DATA:  Acute nonlocalized abdominal pain. Emesis and diarrhea this week. EXAM: CT ABDOMEN AND PELVIS WITH CONTRAST TECHNIQUE: Multidetector CT imaging of the abdomen and pelvis was performed using the standard protocol following bolus administration of intravenous contrast. CONTRAST:  OMNIPAQUE IOHEXOL 300 MG/ML  SOLN COMPARISON:  None. FINDINGS: Lower chest: Lung bases are clear. Hepatobiliary: No focal liver abnormality is seen. No gallstones, gallbladder wall thickening, or biliary dilatation. Pancreas: Unremarkable. No pancreatic ductal dilatation or surrounding inflammatory changes. Spleen: Normal in size without focal abnormality. Adrenals/Urinary Tract: Adrenal glands are unremarkable. Kidneys are normal, without renal calculi, focal lesion, or hydronephrosis. Bladder is unremarkable. Stomach/Bowel: Stomach, small bowel, and colon are not abnormally distended. No wall thickening or inflammatory changes appreciated. Appendix is normal. Vascular/Lymphatic: No significant vascular findings are present. No enlarged abdominal or pelvic lymph nodes. Reproductive: Prostate is unremarkable. Other: No abdominal wall hernia or abnormality. No abdominopelvic ascites. Musculoskeletal: No acute or significant osseous findings. IMPRESSION: No acute process demonstrated in the abdomen or pelvis. No evidence of bowel obstruction or inflammation. Electronically Signed   By: Burman Nieves M.D.   On: 01/22/2021 22:25    Procedures Procedures   Medications Ordered in ED Medications  ondansetron [facility]) injection 4 mg (4 mg Intravenous Given 01/22/21 2117)  morphine 4 MG/ML injection 4 mg (4 mg Intravenous Given 01/22/21 2122)  sodium chloride 0.9 % bolus 1,000 mL (1,000 mLs Intravenous New  Bag/Given 01/22/21 2116)  iohexol (OMNIPAQUE) 300 MG/ML solution 100 mL (100 mLs Intravenous Contrast Given 01/22/21 2200)    ED Course  I have reviewed the triage vital signs and the nursing notes.  Pertinent labs & imaging results that were available during my care of the patient were reviewed by me and considered in my medical decision making (see chart for details).  Clinical Course as of 01/22/21 2232  Sat Jan 22, 2021  [id number] 45 year old male with complaint of generalized abdominal pain with nausea, vomiting, diarrhea as above.  On exam, found to have mild generalized abdominal tenderness.  Patient was given IV fluids, morphine and Zofran.  Labs were assessed without significant findings including CBC, CMP, lipase and urinalysis.  CT abdomen pelvis unremarkable.  Patient is feeling better on recheck and ready for discharge home.  He will be discharged with a prescription for Zofran, given return to ER precautions and advised to recheck with his primary care as needed.  Vitals were reviewed without significant findings, O2 sat 97% on room air. [LM]    Clinical Course User  Index [LM] Alden Hipp   MDM Rules/Calculators/A&P                           Final Clinical Impression(s) / ED Diagnoses Final diagnoses:  Generalized abdominal pain  Nausea vomiting and diarrhea    Rx / DC Orders ED Discharge Orders          Ordered    ondansetron (ZOFRAN ODT) 4 MG disintegrating tablet  Every 8 hours PRN        01/22/21 2229             Jeannie Fend, PA-C 01/22/21 2232    Wynetta Fines, MD 01/26/21 [phone]

## 2021-01-22 NOTE — ED Notes (Signed)
E-signature pad unavailable at time of pt discharge. This RN discussed discharge materials with pt and answered all pt questions. Pt stated understanding of discharge material. ? ?

## 2021-01-22 NOTE — ED Triage Notes (Signed)
Patient reports generalized abdominal pain with emesis and diarrhea onset this week , no fever or chills .

## 2021-02-06 ENCOUNTER — Emergency Department (HOSPITAL_COMMUNITY): Payer: Self-pay

## 2021-02-06 ENCOUNTER — Observation Stay (HOSPITAL_COMMUNITY)
Admission: EM | Admit: 2021-02-06 | Discharge: 2021-02-07 | Disposition: A | Discharge status: Home / Self Care | Payer: Self-pay | Attending: Orthopaedic Surgery | Admitting: Orthopaedic Surgery

## 2021-02-06 ENCOUNTER — Encounter (HOSPITAL_COMMUNITY): Payer: Self-pay | Admitting: Surgery

## 2021-02-06 DIAGNOSIS — W3400XA Accidental discharge from unspecified firearms or gun, initial encounter: Secondary | ICD-10-CM

## 2021-02-06 DIAGNOSIS — Z419 Encounter for procedure for purposes other than remedying health state, unspecified: Secondary | ICD-10-CM

## 2021-02-06 DIAGNOSIS — S42309A Unspecified fracture of shaft of humerus, unspecified arm, initial encounter for closed fracture: Secondary | ICD-10-CM | POA: Diagnosis present

## 2021-02-06 DIAGNOSIS — Z23 Encounter for immunization: Secondary | ICD-10-CM | POA: Insufficient documentation

## 2021-02-06 DIAGNOSIS — F17210 Nicotine dependence, cigarettes, uncomplicated: Secondary | ICD-10-CM | POA: Insufficient documentation

## 2021-02-06 DIAGNOSIS — Y900 Blood alcohol level of less than 20 mg/100 ml: Secondary | ICD-10-CM | POA: Insufficient documentation

## 2021-02-06 DIAGNOSIS — Y92512 Supermarket, store or market as the place of occurrence of the external cause: Secondary | ICD-10-CM | POA: Insufficient documentation

## 2021-02-06 DIAGNOSIS — S42471B Displaced transcondylar fracture of right humerus, initial encounter for open fracture: Principal | ICD-10-CM | POA: Insufficient documentation

## 2021-02-06 DIAGNOSIS — Z20822 Contact with and (suspected) exposure to covid-19: Secondary | ICD-10-CM | POA: Insufficient documentation

## 2021-02-06 HISTORY — DX: Other specified health status: Z78.9

## 2021-02-06 LAB — I-STAT CHEM 8, ED
BUN: 15 mg/dL (ref 6–20)
Calcium, Ion: 0.98 mmol/L — ABNORMAL LOW (ref 1.15–1.40)
Chloride: 105 mmol/L (ref 98–111)
Creatinine, Ser: 1.6 mg/dL — ABNORMAL HIGH (ref 0.61–1.24)
Glucose, Bld: 119 mg/dL — ABNORMAL HIGH (ref 70–99)
HCT: 43 % (ref 39.0–52.0)
Hemoglobin: 14.6 g/dL (ref 13.0–17.0)
Potassium: 4.3 mmol/L (ref 3.5–5.1)
Sodium: 138 mmol/L (ref 135–145)
TCO2: 22 mmol/L (ref 22–32)

## 2021-02-06 LAB — CBC
HCT: 42.4 % (ref 39.0–52.0)
Hemoglobin: 13.6 g/dL (ref 13.0–17.0)
MCH: 30.6 pg (ref 26.0–34.0)
MCHC: 32.1 g/dL (ref 30.0–36.0)
MCV: 95.3 fL (ref 80.0–100.0)
Platelets: 527 10*3/uL — ABNORMAL HIGH (ref 150–400)
RBC: 4.45 MIL/uL (ref 4.22–5.81)
RDW: 13.5 % (ref 11.5–15.5)
WBC: 10.7 10*3/uL — ABNORMAL HIGH (ref 4.0–10.5)
nRBC: 0 % (ref 0.0–0.2)

## 2021-02-06 LAB — PROTIME-INR
INR: 1 (ref 0.8–1.2)
Prothrombin Time: 13.6 seconds (ref 11.4–15.2)

## 2021-02-06 LAB — LACTIC ACID, PLASMA: Lactic Acid, Venous: 6.1 mmol/L (ref 0.5–1.9)

## 2021-02-06 LAB — ETHANOL: Alcohol, Ethyl (B): 174 mg/dL — ABNORMAL HIGH (ref <10)

## 2021-02-06 MED ORDER — FENTANYL CITRATE (PF) 100 MCG/2ML IJ SOLN
INTRAMUSCULAR | Status: AC
  Administered 2021-02-06: 50 ug
  Filled 2021-02-06: qty 2

## 2021-02-06 MED ORDER — TETANUS-DIPHTH-ACELL PERTUSSIS 5-2.5-18.5 LF-MCG/0.5 IM SUSY
0.5000 mL | PREFILLED_SYRINGE | Freq: Once | INTRAMUSCULAR | Status: AC
  Administered 2021-02-06: 0.5 mL via INTRAMUSCULAR
  Filled 2021-02-06: qty 0.5

## 2021-02-06 MED ORDER — IOHEXOL 350 MG/ML SOLN
75.0000 mL | Freq: Once | INTRAVENOUS | Status: AC | PRN
  Administered 2021-02-06: 75 mL via INTRAVENOUS

## 2021-02-06 MED ORDER — CEFAZOLIN SODIUM-DEXTROSE 2-4 GM/100ML-% IV SOLN
2.0000 g | Freq: Once | INTRAVENOUS | Status: AC
  Administered 2021-02-06: 2 g via INTRAVENOUS
  Filled 2021-02-06: qty 100

## 2021-02-06 MED ORDER — FENTANYL CITRATE PF 50 MCG/ML IJ SOSY: 50.0000 ug | PREFILLED_SYRINGE | Freq: Once | INTRAMUSCULAR | Status: DC

## 2021-02-06 NOTE — ED Notes (Signed)
Phlebotomy Arrived
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
## 2021-02-06 NOTE — Consult Note (Signed)
Responded to page, pt not available, no family present, staff will page again if further chaplain services needed.   Rev. Dr. Donnel Saxon Chaplain
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
## 2021-02-06 NOTE — ED Notes (Signed)
Trauma attending surgeon, Sophronia Simas assessing patient.
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
## 2021-02-06 NOTE — ED Notes (Signed)
Xray at bedside awaiting patient arrival.

## 2021-02-06 NOTE — ED Notes (Signed)
Respiratory at bedside.

## 2021-02-06 NOTE — ED Notes (Signed)
Ortho Tech at bedside awaiting patient arrival
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
## 2021-02-07 ENCOUNTER — Encounter (HOSPITAL_COMMUNITY): Payer: Self-pay

## 2021-02-07 ENCOUNTER — Other Ambulatory Visit: Payer: Self-pay

## 2021-02-07 ENCOUNTER — Observation Stay (HOSPITAL_COMMUNITY): Payer: Self-pay

## 2021-02-07 ENCOUNTER — Encounter (HOSPITAL_COMMUNITY)
Admission: EM | Disposition: A | Discharge status: Home / Self Care | Payer: Self-pay | Source: Home / Self Care | Attending: Emergency Medicine

## 2021-02-07 ENCOUNTER — Observation Stay (HOSPITAL_COMMUNITY): Payer: Self-pay | Admitting: Registered Nurse

## 2021-02-07 DIAGNOSIS — S42471B Displaced transcondylar fracture of right humerus, initial encounter for open fracture: Principal | ICD-10-CM

## 2021-02-07 HISTORY — PX: ORIF HUMERUS FRACTURE: SHX2126

## 2021-02-07 LAB — COMPREHENSIVE METABOLIC PANEL
ALT: 18 U/L (ref 0–44)
AST: 51 U/L — ABNORMAL HIGH (ref 15–41)
Albumin: 4.5 g/dL (ref 3.5–5.0)
Alkaline Phosphatase: 40 U/L (ref 38–126)
Anion gap: 16 — ABNORMAL HIGH (ref 5–15)
BUN: 12 mg/dL (ref 6–20)
CO2: 18 mmol/L — ABNORMAL LOW (ref 22–32)
Calcium: 8.9 mg/dL (ref 8.9–10.3)
Chloride: 103 mmol/L (ref 98–111)
Creatinine, Ser: 1.41 mg/dL — ABNORMAL HIGH (ref 0.61–1.24)
GFR, Estimated: >60 mL/min (ref >60)
Glucose, Bld: 126 mg/dL — ABNORMAL HIGH (ref 70–99)
Potassium: 4.2 mmol/L (ref 3.5–5.1)
Sodium: 137 mmol/L (ref 135–145)
Total Bilirubin: 1.4 mg/dL — ABNORMAL HIGH (ref 0.3–1.2)
Total Protein: 6.7 g/dL (ref 6.5–8.1)

## 2021-02-07 LAB — LACTIC ACID, PLASMA
Lactic Acid, Venous: 2.9 mmol/L (ref 0.5–1.9)
Lactic Acid, Venous: 2.9 mmol/L (ref 0.5–1.9)

## 2021-02-07 LAB — TYPE AND SCREEN
ABO/RH(D): O POS
Antibody Screen: NEGATIVE
Unit division: 0
Unit division: 0
Unit division: 0
Unit division: 0

## 2021-02-07 LAB — URINALYSIS, ROUTINE W REFLEX MICROSCOPIC
Bilirubin Urine: NEGATIVE
Glucose, UA: NEGATIVE mg/dL
Hgb urine dipstick: NEGATIVE
Ketones, ur: NEGATIVE mg/dL
Leukocytes,Ua: NEGATIVE
Nitrite: NEGATIVE
Protein, ur: NEGATIVE mg/dL
Specific Gravity, Urine: 1.033 — ABNORMAL HIGH (ref 1.005–1.030)
pH: 5 (ref 5.0–8.0)

## 2021-02-07 LAB — CBC
HCT: 41.3 % (ref 39.0–52.0)
Hemoglobin: 13.7 g/dL (ref 13.0–17.0)
MCH: 29.8 pg (ref 26.0–34.0)
MCHC: 33.2 g/dL (ref 30.0–36.0)
MCV: 89.8 fL (ref 80.0–100.0)
Platelets: 388 10*3/uL (ref 150–400)
RBC: 4.6 MIL/uL (ref 4.22–5.81)
RDW: 15.2 % (ref 11.5–15.5)
WBC: 15 10*3/uL — ABNORMAL HIGH (ref 4.0–10.5)
nRBC: 0 % (ref 0.0–0.2)

## 2021-02-07 LAB — BASIC METABOLIC PANEL
Anion gap: 17 — ABNORMAL HIGH (ref 5–15)
BUN: 9 mg/dL (ref 6–20)
CO2: 22 mmol/L (ref 22–32)
Calcium: 8.9 mg/dL (ref 8.9–10.3)
Chloride: 99 mmol/L (ref 98–111)
Creatinine, Ser: 1.08 mg/dL (ref 0.61–1.24)
GFR, Estimated: >60 mL/min (ref >60)
Glucose, Bld: 89 mg/dL (ref 70–99)
Potassium: 3.7 mmol/L (ref 3.5–5.1)
Sodium: 138 mmol/L (ref 135–145)

## 2021-02-07 LAB — BPAM RBC
Blood Product Expiration Date: 202209232359
Blood Product Expiration Date: 202209242359
Blood Product Expiration Date: 202210052359
Blood Product Expiration Date: 202210052359
ISSUE DATE / TIME: 202209042256
ISSUE DATE / TIME: 202209042301
ISSUE DATE / TIME: 202209042304
ISSUE DATE / TIME: 202209042304
Unit Type and Rh: 5100
Unit Type and Rh: 5100
Unit Type and Rh: 5100
Unit Type and Rh: 5100

## 2021-02-07 LAB — ABO/RH: ABO/RH(D): O POS

## 2021-02-07 LAB — BLOOD PRODUCT ORDER (VERBAL) VERIFICATION

## 2021-02-07 LAB — HIV ANTIBODY (ROUTINE TESTING W REFLEX): HIV Screen 4th Generation wRfx: NONREACTIVE

## 2021-02-07 LAB — RESP PANEL BY RT-PCR (FLU A&B, COVID) ARPGX2
Influenza A by PCR: NEGATIVE
Influenza B by PCR: NEGATIVE
SARS Coronavirus 2 by RT PCR: NEGATIVE

## 2021-02-07 SURGERY — OPEN REDUCTION INTERNAL FIXATION (ORIF) PROXIMAL HUMERUS FRACTURE
Anesthesia: General | Site: Arm Upper | Laterality: Right

## 2021-02-07 MED ORDER — ONDANSETRON HCL 4 MG/2ML IJ SOLN
INTRAMUSCULAR | Status: AC
  Filled 2021-02-07: qty 2

## 2021-02-07 MED ORDER — LIDOCAINE 2% (20 MG/ML) 5 ML SYRINGE
INTRAMUSCULAR | Status: DC | PRN
  Administered 2021-02-07: 60 mg via INTRAVENOUS

## 2021-02-07 MED ORDER — CHLORHEXIDINE GLUCONATE 0.12 % MT SOLN
15.0000 mL | Freq: Once | OROMUCOSAL | Status: AC
  Administered 2021-02-07: 15 mL via OROMUCOSAL

## 2021-02-07 MED ORDER — ACETAMINOPHEN 325 MG PO TABS
650.0000 mg | ORAL_TABLET | Freq: Four times a day (QID) | ORAL | Status: DC
  Administered 2021-02-07: 650 mg via ORAL
  Filled 2021-02-07: qty 2

## 2021-02-07 MED ORDER — LACTATED RINGERS IV SOLN: INTRAVENOUS | Status: DC

## 2021-02-07 MED ORDER — DOCUSATE SODIUM 100 MG PO CAPS
100.0000 mg | ORAL_CAPSULE | Freq: Two times a day (BID) | ORAL | Status: DC
  Administered 2021-02-07: 100 mg via ORAL
  Filled 2021-02-07 (×2): qty 1

## 2021-02-07 MED ORDER — OXYCODONE HCL 5 MG PO CAPS: 5.0000 mg | ORAL_CAPSULE | ORAL | 0 refills | Status: DC | PRN

## 2021-02-07 MED ORDER — PHENYLEPHRINE 40 MCG/ML (10ML) SYRINGE FOR IV PUSH (FOR BLOOD PRESSURE SUPPORT)
PREFILLED_SYRINGE | INTRAVENOUS | Status: AC
  Filled 2021-02-07: qty 10

## 2021-02-07 MED ORDER — ORAL CARE MOUTH RINSE: 15.0000 mL | Freq: Once | OROMUCOSAL | Status: AC

## 2021-02-07 MED ORDER — CEFAZOLIN SODIUM-DEXTROSE 2-3 GM-%(50ML) IV SOLR
INTRAVENOUS | Status: DC | PRN
  Administered 2021-02-07: 2 g via INTRAVENOUS

## 2021-02-07 MED ORDER — PHENYLEPHRINE 40 MCG/ML (10ML) SYRINGE FOR IV PUSH (FOR BLOOD PRESSURE SUPPORT)
PREFILLED_SYRINGE | INTRAVENOUS | Status: DC | PRN
  Administered 2021-02-07 (×3): 80 ug via INTRAVENOUS

## 2021-02-07 MED ORDER — ACETAMINOPHEN 500 MG PO TABS: 500.0000 mg | ORAL_TABLET | Freq: Three times a day (TID) | ORAL | 0 refills | Status: AC

## 2021-02-07 MED ORDER — FENTANYL CITRATE (PF) 250 MCG/5ML IJ SOLN
INTRAMUSCULAR | Status: DC | PRN
  Administered 2021-02-07: 50 ug via INTRAVENOUS
  Administered 2021-02-07: 100 ug via INTRAVENOUS
  Administered 2021-02-07 (×2): 50 ug via INTRAVENOUS

## 2021-02-07 MED ORDER — IBUPROFEN 800 MG PO TABS: 800.0000 mg | ORAL_TABLET | Freq: Three times a day (TID) | ORAL | 0 refills | Status: AC

## 2021-02-07 MED ORDER — OXYCODONE HCL 5 MG PO TABS
5.0000 mg | ORAL_TABLET | ORAL | Status: DC | PRN
  Administered 2021-02-07: 10 mg via ORAL
  Filled 2021-02-07: qty 2

## 2021-02-07 MED ORDER — ROCURONIUM BROMIDE 10 MG/ML (PF) SYRINGE
PREFILLED_SYRINGE | INTRAVENOUS | Status: DC | PRN
  Administered 2021-02-07: 30 mg via INTRAVENOUS
  Administered 2021-02-07: 70 mg via INTRAVENOUS

## 2021-02-07 MED ORDER — BUPIVACAINE HCL 0.25 % IJ SOLN
INTRAMUSCULAR | Status: DC | PRN
  Administered 2021-02-07: 20 mL

## 2021-02-07 MED ORDER — MIDAZOLAM HCL 2 MG/2ML IJ SOLN
INTRAMUSCULAR | Status: AC
  Filled 2021-02-07: qty 2

## 2021-02-07 MED ORDER — MIDAZOLAM HCL 2 MG/2ML IJ SOLN
INTRAMUSCULAR | Status: DC | PRN
  Administered 2021-02-07: 2 mg via INTRAVENOUS

## 2021-02-07 MED ORDER — PROPOFOL 10 MG/ML IV BOLUS
INTRAVENOUS | Status: DC | PRN
  Administered 2021-02-07: 160 mg via INTRAVENOUS

## 2021-02-07 MED ORDER — FENTANYL CITRATE (PF) 100 MCG/2ML IJ SOLN
INTRAMUSCULAR | Status: AC
  Administered 2021-02-07: 100 ug
  Filled 2021-02-07: qty 2

## 2021-02-07 MED ORDER — ENOXAPARIN SODIUM 30 MG/0.3ML IJ SOSY: 30.0000 mg | PREFILLED_SYRINGE | Freq: Two times a day (BID) | INTRAMUSCULAR | Status: DC

## 2021-02-07 MED ORDER — SUGAMMADEX SODIUM 200 MG/2ML IV SOLN
INTRAVENOUS | Status: DC | PRN
  Administered 2021-02-07 (×2): 100 mg via INTRAVENOUS

## 2021-02-07 MED ORDER — 0.9 % SODIUM CHLORIDE (POUR BTL) OPTIME
TOPICAL | Status: DC | PRN
  Administered 2021-02-07: 1000 mL

## 2021-02-07 MED ORDER — ONDANSETRON HCL 4 MG/2ML IJ SOLN
INTRAMUSCULAR | Status: DC | PRN
  Administered 2021-02-07: 4 mg via INTRAVENOUS

## 2021-02-07 MED ORDER — ROCURONIUM BROMIDE 10 MG/ML (PF) SYRINGE
PREFILLED_SYRINGE | INTRAVENOUS | Status: AC
  Filled 2021-02-07: qty 10

## 2021-02-07 MED ORDER — HYDROMORPHONE HCL 1 MG/ML IJ SOLN
0.5000 mg | INTRAMUSCULAR | Status: DC | PRN
Start: 2021-02-07 — End: 2021-02-07
  Administered 2021-02-07: 0.5 mg via INTRAVENOUS
  Filled 2021-02-07: qty 1

## 2021-02-07 MED ORDER — TRANEXAMIC ACID-NACL 1000-0.7 MG/100ML-% IV SOLN
INTRAVENOUS | Status: DC | PRN
  Administered 2021-02-07: 1000 mg via INTRAVENOUS

## 2021-02-07 MED ORDER — LIDOCAINE 2% (20 MG/ML) 5 ML SYRINGE
INTRAMUSCULAR | Status: AC
  Filled 2021-02-07: qty 5

## 2021-02-07 MED ORDER — ONDANSETRON HCL 4 MG/2ML IJ SOLN: 4.0000 mg | Freq: Four times a day (QID) | INTRAMUSCULAR | Status: DC | PRN

## 2021-02-07 MED ORDER — PROPOFOL 10 MG/ML IV BOLUS
INTRAVENOUS | Status: AC
  Filled 2021-02-07: qty 20

## 2021-02-07 MED ORDER — FENTANYL CITRATE (PF) 250 MCG/5ML IJ SOLN
INTRAMUSCULAR | Status: AC
  Filled 2021-02-07: qty 5

## 2021-02-07 MED ORDER — DEXAMETHASONE SODIUM PHOSPHATE 10 MG/ML IJ SOLN
INTRAMUSCULAR | Status: AC
  Filled 2021-02-07: qty 1

## 2021-02-07 MED ORDER — ALBUMIN HUMAN 5 % IV SOLN: INTRAVENOUS | Status: DC | PRN

## 2021-02-07 MED ORDER — VANCOMYCIN HCL 1000 MG IV SOLR
INTRAVENOUS | Status: AC
  Filled 2021-02-07: qty 20

## 2021-02-07 MED ORDER — TRANEXAMIC ACID-NACL 1000-0.7 MG/100ML-% IV SOLN
INTRAVENOUS | Status: AC
  Filled 2021-02-07: qty 100

## 2021-02-07 MED ORDER — CHLORHEXIDINE GLUCONATE 0.12 % MT SOLN
OROMUCOSAL | Status: AC
  Filled 2021-02-07: qty 15

## 2021-02-07 MED ORDER — FENTANYL CITRATE PF 50 MCG/ML IJ SOSY: 100.0000 ug | PREFILLED_SYRINGE | Freq: Once | INTRAMUSCULAR | Status: DC

## 2021-02-07 MED ORDER — ASPIRIN EC 325 MG PO TBEC: 325.0000 mg | DELAYED_RELEASE_TABLET | Freq: Every day | ORAL | 0 refills | Status: AC

## 2021-02-07 MED ORDER — BUPIVACAINE HCL (PF) 0.25 % IJ SOLN
INTRAMUSCULAR | Status: AC
  Filled 2021-02-07: qty 30

## 2021-02-07 MED ORDER — VANCOMYCIN HCL 1000 MG IV SOLR
INTRAVENOUS | Status: DC | PRN
  Administered 2021-02-07: 1000 mg via TOPICAL

## 2021-02-07 MED ORDER — ONDANSETRON 4 MG PO TBDP: 4.0000 mg | ORAL_TABLET | Freq: Four times a day (QID) | ORAL | Status: DC | PRN

## 2021-02-07 MED ORDER — DEXAMETHASONE SODIUM PHOSPHATE 10 MG/ML IJ SOLN
INTRAMUSCULAR | Status: DC | PRN
  Administered 2021-02-07: 5 mg via INTRAVENOUS

## 2021-02-07 MED ORDER — CEFAZOLIN SODIUM-DEXTROSE 2-4 GM/100ML-% IV SOLN
INTRAVENOUS | Status: AC
  Filled 2021-02-07: qty 100

## 2021-02-07 SURGICAL SUPPLY — 72 items
BAG COUNTER SPONGE SURGICOUNT (BAG) ×2 IMPLANT
BIT DRILL L45 QC 2.7X125 (BIT) ×2 IMPLANT
BIT DRILL QC 2.5X135 (BIT) ×2 IMPLANT
BIT DRILL QC 2.7X125 (BIT) ×4
BIT DRILL QC 2X140 (BIT) ×2 IMPLANT
BNDG ELASTIC 4X5.8 VLCR STR LF (GAUZE/BANDAGES/DRESSINGS) ×2 IMPLANT
BNDG ELASTIC 6X10 VLCR STRL LF (GAUZE/BANDAGES/DRESSINGS) ×2 IMPLANT
DRAPE C-ARM 42X72 X-RAY (DRAPES) ×2 IMPLANT
DRAPE IMP U-DRAPE 54X76 (DRAPES) ×2 IMPLANT
DRAPE INCISE IOBAN 66X45 STRL (DRAPES) ×2 IMPLANT
DRAPE U-SHAPE 47X51 STRL (DRAPES) ×2 IMPLANT
DRSG PAD ABDOMINAL 8X10 ST (GAUZE/BANDAGES/DRESSINGS) ×4 IMPLANT
ELECT REM PT RETURN 9FT ADLT (ELECTROSURGICAL) ×2
ELECTRODE REM PT RTRN 9FT ADLT (ELECTROSURGICAL) ×1 IMPLANT
GAUZE SPONGE 4X4 12PLY STRL (GAUZE/BANDAGES/DRESSINGS) ×2 IMPLANT
GAUZE XEROFORM 1X8 LF (GAUZE/BANDAGES/DRESSINGS) ×2 IMPLANT
GAUZE XEROFORM 5X9 LF (GAUZE/BANDAGES/DRESSINGS) ×2 IMPLANT
GLOVE SRG 8 PF TXTR STRL LF DI (GLOVE) ×2 IMPLANT
GLOVE SURG ENC MOIS LTX SZ8 (GLOVE) ×2 IMPLANT
GLOVE SURG ORTHO LTX SZ7.5 (GLOVE) ×2 IMPLANT
GLOVE SURG UNDER POLY LF SZ8 (GLOVE) ×4
GOWN STRL REUS W/ TWL LRG LVL3 (GOWN DISPOSABLE) ×1 IMPLANT
GOWN STRL REUS W/ TWL XL LVL3 (GOWN DISPOSABLE) ×4 IMPLANT
GOWN STRL REUS W/TWL LRG LVL3 (GOWN DISPOSABLE) ×2
GOWN STRL REUS W/TWL XL LVL3 (GOWN DISPOSABLE) ×8
K-WIRE 1.6X150 (WIRE) ×2
K-WIRE 2.0X150M (WIRE) ×2
KIT BASIN OR (CUSTOM PROCEDURE TRAY) ×2 IMPLANT
KIT TURNOVER KIT B (KITS) ×2 IMPLANT
KWIRE 1.6X150 (WIRE) ×1 IMPLANT
KWIRE 2.0X150M (WIRE) ×1 IMPLANT
MANIFOLD NEPTUNE II (INSTRUMENTS) ×2 IMPLANT
NS IRRIG 1000ML POUR BTL (IV SOLUTION) ×2 IMPLANT
PACK SHOULDER (CUSTOM PROCEDURE TRAY) ×2 IMPLANT
PACK UNIVERSAL I (CUSTOM PROCEDURE TRAY) ×2 IMPLANT
PAD ABD 8X10 STRL (GAUZE/BANDAGES/DRESSINGS) ×2 IMPLANT
PAD ARMBOARD 7.5X6 YLW CONV (MISCELLANEOUS) ×4 IMPLANT
PAD CAST 4YDX4 CTTN HI CHSV (CAST SUPPLIES) ×1 IMPLANT
PADDING CAST COTTON 4X4 STRL (CAST SUPPLIES) ×2
PADDING CAST COTTON 6X4 STRL (CAST SUPPLIES) ×2 IMPLANT
PLATE LOCK VA 2.7X153 9H RT (Plate) ×2 IMPLANT
SCREW 2.7X36MM (Screw) ×1 IMPLANT
SCREW CORTEX 2.7X22 ST (Screw) ×1 IMPLANT
SCREW CORTEX 2.7X36 ST T8 (Screw) ×1 IMPLANT
SCREW CORTEX 3.5 26MM (Screw) ×1 IMPLANT
SCREW CORTEX 3.5 28MM (Screw) ×2 IMPLANT
SCREW CORTEX 3.5 30MM (Screw) ×1 IMPLANT
SCREW LOCK CORT ST 3.5X26 (Screw) ×1 IMPLANT
SCREW LOCK CORT ST 3.5X28 (Screw) ×2 IMPLANT
SCREW LOCK CORT ST 3.5X30 (Screw) ×1 IMPLANT
SCREW LOCK VA ST 2.7X26 (Screw) ×2 IMPLANT
SCREW LOCKING 2.7X28 (Screw) ×2 IMPLANT
SCREW LOCKING VA 2.7X30MM (Screw) ×2 IMPLANT
SCREW LOCKING VA 2.7X50MM (Screw) ×2 IMPLANT
SCREW METAPHYSEAL 2.7X56 (Screw) ×2 IMPLANT
SCREW SELF TAP 22M (Screw) ×1 IMPLANT
SPLINT PLASTER CAST XFAST 5X30 (CAST SUPPLIES) ×1 IMPLANT
SPLINT PLASTER XFAST SET 5X30 (CAST SUPPLIES) ×1
SPONGE T-LAP 18X18 ~~LOC~~+RFID (SPONGE) ×6 IMPLANT
SPONGE T-LAP 4X18 ~~LOC~~+RFID (SPONGE) ×4 IMPLANT
STAPLER VISISTAT 35W (STAPLE) ×2 IMPLANT
SUCTION FRAZIER HANDLE 10FR (MISCELLANEOUS) ×2
SUCTION TUBE FRAZIER 10FR DISP (MISCELLANEOUS) ×1 IMPLANT
SUT ETHILON 2 0 FS 18 (SUTURE) ×4 IMPLANT
SUT VIC AB 0 CT1 27 (SUTURE) ×4
SUT VIC AB 0 CT1 27XBRD ANBCTR (SUTURE) ×2 IMPLANT
SUT VIC AB 2-0 CT1 27 (SUTURE) ×4
SUT VIC AB 2-0 CT1 TAPERPNT 27 (SUTURE) ×2 IMPLANT
TOWEL GREEN STERILE (TOWEL DISPOSABLE) ×2 IMPLANT
TOWEL GREEN STERILE FF (TOWEL DISPOSABLE) ×2 IMPLANT
WATER STERILE IRR 1000ML POUR (IV SOLUTION) ×2 IMPLANT
YANKAUER SUCT BULB TIP NO VENT (SUCTIONS) ×2 IMPLANT
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
## 2021-02-07 NOTE — ED Notes (Signed)
Wound care completed. Wound irrigated with NS then ABD pad placed and secured with gauze bandage and tape. Mellody Dance, RN assisted.

## 2021-02-07 NOTE — Anesthesia Postprocedure Evaluation (Signed)
Anesthesia Post Note  Patient: Louis Thompson  Procedure(s) Performed: OPEN REDUCTION INTERNAL FIXATION (ORIF) DISTAL HUMERUS FRACTURE (Right: Arm Upper)     Patient location during evaluation: PACU Anesthesia Type: General Level of consciousness: awake and alert Pain management: pain level controlled Vital Signs Assessment: post-procedure vital signs reviewed and stable Respiratory status: spontaneous breathing, nonlabored ventilation, respiratory function stable and patient connected to nasal cannula oxygen Cardiovascular status: blood pressure returned to baseline and stable Postop Assessment: no apparent nausea or vomiting Anesthetic complications: no   No notable events documented.  Last Vitals:  Vitals:   02/07/21 1253 02/07/21 1300  BP: (!) 148/77 124/83  Pulse: 90   Resp: 19   Temp: (P) 37.1 C 37.1 C  SpO2: 95%     Last Pain:  Vitals:   02/07/21 1300  TempSrc: Oral  PainSc:                  Analaura Messler
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
## 2021-02-07 NOTE — Anesthesia Postprocedure Evaluation (Signed)
Anesthesia Post Note  Patient: Louis Thompson  Procedure(s) Performed: OPEN REDUCTION INTERNAL FIXATION (ORIF) DISTAL HUMERUS FRACTURE (Right: Arm Upper)     Patient location during evaluation: PACU Anesthesia Type: General Level of consciousness: awake and alert Pain management: pain level controlled Vital Signs Assessment: post-procedure vital signs reviewed and stable Respiratory status: spontaneous breathing, nonlabored ventilation, respiratory function stable and patient connected to nasal cannula oxygen Cardiovascular status: blood pressure returned to baseline and stable Postop Assessment: no apparent nausea or vomiting Anesthetic complications: no   No notable events documented.  Last Vitals:  Vitals:   02/07/21 1253 02/07/21 1300  BP: (!) 148/77 124/83  Pulse: 90   Resp: 19   Temp: (P) 37.1 C 37.1 C  SpO2: 95%     Last Pain:  Vitals:   02/07/21 1300  TempSrc: Oral  PainSc:                  Asia Favata     
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
## 2021-02-07 NOTE — Transfer of Care (Signed)
Immediate Anesthesia Transfer of Care Note  Patient: Louis Thompson  Procedure(s) Performed: OPEN REDUCTION INTERNAL FIXATION (ORIF) DISTAL HUMERUS FRACTURE (Right: Arm Upper)  Patient Location: PACU  Anesthesia Type:General  Level of Consciousness: awake and drowsy  Airway & Oxygen Therapy: Patient Spontanous Breathing and Patient connected to nasal cannula oxygen  Post-op Assessment: Report given to RN and Post -op Vital signs reviewed and stable  Post vital signs: Reviewed and stable  Last Vitals:  Vitals Value Taken Time  BP 167/80 02/07/21 1222  Temp 36.5 C 02/07/21 1222  Pulse 79 02/07/21 1223  Resp 23 02/07/21 1223  SpO2 96 % 02/07/21 1223  Vitals shown include unvalidated device data.  Last Pain:  Vitals:   02/07/21 0830  TempSrc: Oral  PainSc:          Complications: No notable events documented.
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
## 2021-02-07 NOTE — Anesthesia Procedure Notes (Signed)
Procedure Name: Intubation Date/Time: 02/07/2021 9:19 AM Performed by: Trinna Post., CRNA Pre-anesthesia Checklist: Patient identified, Emergency Drugs available, Suction available, Patient being monitored and Timeout performed Patient Re-evaluated:Patient Re-evaluated prior to induction Oxygen Delivery Method: Circle system utilized Preoxygenation: Pre-oxygenation with 100% oxygen Induction Type: IV induction Ventilation: Mask ventilation without difficulty Laryngoscope Size: Mac and 4 Grade View: Grade I Tube type: Oral Tube size: 7.5 mm Number of attempts: 1 Airway Equipment and Method: Stylet Placement Confirmation: ETT inserted through vocal cords under direct vision, positive ETCO2 and breath sounds checked- equal and bilateral Secured at: 22 cm Tube secured with: Tape Dental Injury: Teeth and Oropharynx as per pre-operative assessment
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
## 2021-02-07 NOTE — Progress Notes (Signed)
Orthopedic Tech Progress Note Patient Details:  Louis Thompson 11-Apr-1976 [id number]  Ortho Devices Type of Ortho Device: Long arm splint Ortho Device/Splint Location: Left arm Ortho Device/Splint Interventions: Application   Post Interventions Patient Tolerated: Well  Louis Thompson 02/07/2021, 1:12 AM

## 2021-02-07 NOTE — Significant Event (Addendum)
Patient seen and examined postoperatively. Continues to have strong 2+ radial pulse. He is firing IO and median nerve. He does have weakness with right thumb IP extension and wrist extension. We discussed operative findings and that the fact that radial nerve was seen/protected and was in continuity throughout. He does have intact sensation in the first dorsal webspace. We discussed the the weakness is likely a neuropraxic type injury that we will observe for the time being. He understands this. Plan for DC today following an additional dose of antibiotics and mobilization with PT. FU with me in 2 weeks.

## 2021-02-07 NOTE — ED Notes (Signed)
Primary RN assisted Grant Ruts, Ortho Tech, with application of posterior splint to RUE
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
## 2021-02-08 ENCOUNTER — Encounter (HOSPITAL_COMMUNITY): Payer: Self-pay | Admitting: Emergency Medicine

## 2021-02-08 ENCOUNTER — Encounter (HOSPITAL_COMMUNITY): Payer: Self-pay | Admitting: Orthopaedic Surgery

## 2021-02-14 ENCOUNTER — Ambulatory Visit (HOSPITAL_BASED_OUTPATIENT_CLINIC_OR_DEPARTMENT_OTHER): Payer: Self-pay | Admitting: Orthopaedic Surgery
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
## 2021-02-16 ENCOUNTER — Ambulatory Visit (INDEPENDENT_AMBULATORY_CARE_PROVIDER_SITE_OTHER): Payer: Self-pay | Admitting: Family Medicine

## 2021-02-16 ENCOUNTER — Other Ambulatory Visit (HOSPITAL_COMMUNITY)
Admission: RE | Admit: 2021-02-16 | Discharge: 2021-02-16 | Disposition: A | Discharge status: Home / Self Care | Payer: Self-pay | Source: Ambulatory Visit | Attending: Family Medicine | Admitting: Family Medicine

## 2021-02-16 ENCOUNTER — Encounter: Payer: Self-pay | Admitting: Family Medicine

## 2021-02-16 ENCOUNTER — Other Ambulatory Visit: Payer: Self-pay

## 2021-02-16 VITALS — BP 129/83 | HR 82 | Wt 161.4 lb

## 2021-02-16 DIAGNOSIS — Z202 Contact with and (suspected) exposure to infections with a predominantly sexual mode of transmission: Secondary | ICD-10-CM | POA: Insufficient documentation

## 2021-02-16 NOTE — Patient Instructions (Signed)
I will follow-up with you once the results of your urine test are available.  Preventing Sexually Transmitted Infections, Adult Sexually transmitted infections (STIs) are diseases that are spread from person to person (are contagious). They are spread, or transmitted, through bodily fluids exchanged during sex or sexual contact. These bodily fluids include saliva, semen, blood, vaginal mucus, and urine. STIs are very common among people of all ages. Some common STIs include: Herpes. Hepatitis B. Chlamydia. Gonorrhea. Syphilis. HPV (human papillomavirus). HIV, also called the human immunodeficiency virus. This is the virus that can cause AIDS (acquired immunodeficiency syndrome). Often, people who have these STIs do not have symptoms. Even without symptoms, these infections can be spread from person to person and require treatment. How can these conditions affect me? STIs can be treated, and many STIs can be cured. However, some STIs cannot be cured and will affect you for the rest of your life. Certain STIs may: Require you to take medicine for the rest of your life. Affect your ability to have children (your fertility). Increase your risk for developing another STI or certain serious health conditions. These may include: Cervical cancer. Head and neck cancer. Pelvic inflammatory disease (PID), in women. Organ damage or damage to other parts of your body, if the infection spreads. Cause problems during pregnancy and may be transmitted to the baby during the pregnancy or childbirth. What can increase my risk? You may have an increased risk for developing an STI if: You have unprotected sex. Sex includes oral, vaginal, or anal sex. You have more than one sex partner. You have a sex partner who has multiple sex partners. You have sex with someone who has an STI. You have an STI or you had an STI before. You inject drugs or have a sex partner or partners who inject drugs. What actions can  I take to prevent STIs? The only way to completely prevent STIs is not to have sex of any kind. This is called practicing abstinence. If you are sexually active, you can protect yourself and others by taking these actions to lower your risk of getting an STI: Lifestyle Avoid mixing alcohol, drugs, and sex. Alcohol and drug use can affect your ability to make good decisions and can lead to risky sexual behaviors. Medicines Ask your health care provider about taking pre-exposure prophylaxis (PrEP) to prevent HIV infection. General information  Stay up to date on vaccinations. Certain vaccines can lower your risk of getting certain STIs, such as: Hepatitis A and hepatitis B vaccines. You may have been vaccinated as a young child, but you will likely need a booster shot as a teen or young adult. HPV (human papillomavirus) vaccine. Have only one sex partner (be monogamous) or limit the number of sex partners you have. Use methods that prevent the exchange of body fluids between partners (barrier protection) correctly every time you have sex. Barrier protection can be used during oral, vaginal, or anal sex. Commonly used barrier methods include: Male condom. Male condom. Dental dam. Use a new condom for every sex act from start to finish. Get tested for STIs. Have your partners get tested, too. If you test positive for an STI, follow recommendations from your health care provider about treatment and make sure your sex partners are tested and treated. Birth control pills, injections, implants, and intrauterine devices (IUDs) do not protect against STIs. To prevent both STIs and pregnancy, always use a condom with another form of birth control. Some STIs, such as herpes, are  spread through skin-to-skin contact. A condom may not protect you from getting such STIs. Avoid all sexual contact if you or your partners have herpes and there is an active flare with open sores. Where to find more  information Learn more about STIs from: Centers for Disease Control and Prevention: More information about specific STIs: BloggingList.ca Places to get sexual health counseling and treatment for free or at a low cost: gettested.TonerPromos.no U.S. Department of Health and Human Services: http://hoffman.com/ Summary Sexually transmitted infections (STIs) can spread through exchanging bodily fluids during sexual contact. Fluids include saliva, semen, blood, vaginal mucus, and urine. You may have an increased risk for developing an STI if you have unprotected sex. Sex includes oral, vaginal, or anal sex. If you do have sex, limit your number of sex partners and use barrier protection every time you have sex. This information is not intended to replace advice given to you by your health care provider. Make sure you discuss any questions you have with your health care provider. Document Revised: 07/07/2019 Document Reviewed: 07/07/2019 Elsevier Patient Education  2022 [employer].

## 2021-02-16 NOTE — Assessment & Plan Note (Signed)
  Today we will collect urine cytology testing for gonorrhea, chlamydia and trichomonas We will follow-up with patient once results are available Patient declines HIV and RPR testing

## 2021-02-18 LAB — URINE CYTOLOGY ANCILLARY ONLY
Chlamydia: NEGATIVE
Comment: NEGATIVE
Comment: NEGATIVE
Comment: NORMAL
Neisseria Gonorrhea: NEGATIVE
Trichomonas: NEGATIVE

## 2021-02-21 ENCOUNTER — Encounter (HOSPITAL_BASED_OUTPATIENT_CLINIC_OR_DEPARTMENT_OTHER): Payer: Self-pay | Admitting: Orthopaedic Surgery

## 2021-02-21 ENCOUNTER — Ambulatory Visit (INDEPENDENT_AMBULATORY_CARE_PROVIDER_SITE_OTHER): Payer: Self-pay | Admitting: Orthopaedic Surgery

## 2021-02-21 ENCOUNTER — Other Ambulatory Visit: Payer: Self-pay

## 2021-02-21 VITALS — BP 154/106 | Ht 71.0 in | Wt 161.0 lb

## 2021-02-21 DIAGNOSIS — S42411D Displaced simple supracondylar fracture without intercondylar fracture of right humerus, subsequent encounter for fracture with routine healing: Secondary | ICD-10-CM
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
## 2021-03-06 ENCOUNTER — Other Ambulatory Visit (HOSPITAL_BASED_OUTPATIENT_CLINIC_OR_DEPARTMENT_OTHER): Payer: Self-pay | Admitting: Orthopaedic Surgery

## 2021-03-06 DIAGNOSIS — S42411D Displaced simple supracondylar fracture without intercondylar fracture of right humerus, subsequent encounter for fracture with routine healing: Secondary | ICD-10-CM

## 2021-03-07 ENCOUNTER — Other Ambulatory Visit: Payer: Self-pay

## 2021-03-07 ENCOUNTER — Ambulatory Visit (HOSPITAL_BASED_OUTPATIENT_CLINIC_OR_DEPARTMENT_OTHER)
Admission: RE | Admit: 2021-03-07 | Discharge: 2021-03-07 | Disposition: A | Discharge status: Home / Self Care | Payer: Self-pay | Source: Ambulatory Visit | Attending: Orthopaedic Surgery | Admitting: Orthopaedic Surgery

## 2021-03-07 ENCOUNTER — Ambulatory Visit (INDEPENDENT_AMBULATORY_CARE_PROVIDER_SITE_OTHER): Payer: Self-pay | Admitting: Orthopaedic Surgery

## 2021-03-07 DIAGNOSIS — S42411D Displaced simple supracondylar fracture without intercondylar fracture of right humerus, subsequent encounter for fracture with routine healing: Secondary | ICD-10-CM | POA: Insufficient documentation
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
## 2021-04-04 ENCOUNTER — Other Ambulatory Visit: Payer: Self-pay

## 2021-04-04 ENCOUNTER — Other Ambulatory Visit (HOSPITAL_BASED_OUTPATIENT_CLINIC_OR_DEPARTMENT_OTHER): Payer: Self-pay | Admitting: Orthopaedic Surgery

## 2021-04-04 ENCOUNTER — Ambulatory Visit (INDEPENDENT_AMBULATORY_CARE_PROVIDER_SITE_OTHER): Payer: Self-pay | Admitting: Orthopaedic Surgery

## 2021-04-04 ENCOUNTER — Ambulatory Visit (HOSPITAL_BASED_OUTPATIENT_CLINIC_OR_DEPARTMENT_OTHER)
Admission: RE | Admit: 2021-04-04 | Discharge: 2021-04-04 | Disposition: A | Discharge status: Home / Self Care | Payer: Self-pay | Source: Ambulatory Visit | Attending: Orthopaedic Surgery | Admitting: Orthopaedic Surgery

## 2021-04-04 DIAGNOSIS — S42411D Displaced simple supracondylar fracture without intercondylar fracture of right humerus, subsequent encounter for fracture with routine healing: Secondary | ICD-10-CM
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
## 2021-05-02 ENCOUNTER — Other Ambulatory Visit: Payer: Self-pay

## 2021-05-02 ENCOUNTER — Ambulatory Visit (INDEPENDENT_AMBULATORY_CARE_PROVIDER_SITE_OTHER): Payer: Self-pay | Admitting: Orthopaedic Surgery

## 2021-05-02 DIAGNOSIS — S42411D Displaced simple supracondylar fracture without intercondylar fracture of right humerus, subsequent encounter for fracture with routine healing: Secondary | ICD-10-CM
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
## 2021-06-13 ENCOUNTER — Encounter (HOSPITAL_BASED_OUTPATIENT_CLINIC_OR_DEPARTMENT_OTHER): Payer: Self-pay | Admitting: Orthopaedic Surgery

## 2021-06-15 ENCOUNTER — Other Ambulatory Visit: Payer: Self-pay

## 2021-06-15 ENCOUNTER — Ambulatory Visit (INDEPENDENT_AMBULATORY_CARE_PROVIDER_SITE_OTHER): Payer: Self-pay | Admitting: Orthopaedic Surgery

## 2021-06-15 DIAGNOSIS — S42411D Displaced simple supracondylar fracture without intercondylar fracture of right humerus, subsequent encounter for fracture with routine healing: Secondary | ICD-10-CM
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
## 2021-06-21 ENCOUNTER — Ambulatory Visit: Payer: Self-pay | Admitting: Orthopedic Surgery

## 2021-07-06 ENCOUNTER — Ambulatory Visit (HOSPITAL_BASED_OUTPATIENT_CLINIC_OR_DEPARTMENT_OTHER): Payer: Self-pay | Admitting: Orthopaedic Surgery

## 2021-07-27 ENCOUNTER — Ambulatory Visit (HOSPITAL_BASED_OUTPATIENT_CLINIC_OR_DEPARTMENT_OTHER): Payer: Self-pay | Admitting: Orthopaedic Surgery

## 2021-08-19 ENCOUNTER — Ambulatory Visit (INDEPENDENT_AMBULATORY_CARE_PROVIDER_SITE_OTHER): Payer: Self-pay | Admitting: Orthopaedic Surgery

## 2021-08-19 ENCOUNTER — Other Ambulatory Visit: Payer: Self-pay

## 2021-08-19 DIAGNOSIS — S42411D Displaced simple supracondylar fracture without intercondylar fracture of right humerus, subsequent encounter for fracture with routine healing: Secondary | ICD-10-CM
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
## 2021-08-25 ENCOUNTER — Ambulatory Visit (INDEPENDENT_AMBULATORY_CARE_PROVIDER_SITE_OTHER): Payer: Self-pay | Admitting: Orthopedic Surgery

## 2021-08-25 ENCOUNTER — Encounter: Payer: Self-pay | Admitting: Orthopedic Surgery

## 2021-08-25 ENCOUNTER — Other Ambulatory Visit: Payer: Self-pay

## 2021-08-25 DIAGNOSIS — G5631 Lesion of radial nerve, right upper limb: Secondary | ICD-10-CM

## 2021-08-25 HISTORY — DX: Lesion of radial nerve, right upper limb: G56.31
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
## 2021-09-02 ENCOUNTER — Other Ambulatory Visit: Payer: Self-pay | Admitting: Orthopaedic Surgery

## 2021-09-13 ENCOUNTER — Ambulatory Visit (HOSPITAL_COMMUNITY)
Admission: EM | Admit: 2021-09-13 | Discharge: 2021-09-13 | Disposition: A | Discharge status: Home / Self Care | Payer: Self-pay | Attending: Family Medicine | Admitting: Family Medicine

## 2021-09-13 ENCOUNTER — Encounter (HOSPITAL_COMMUNITY): Payer: Self-pay | Admitting: Emergency Medicine

## 2021-09-13 ENCOUNTER — Other Ambulatory Visit: Payer: Self-pay

## 2021-09-13 ENCOUNTER — Ambulatory Visit (INDEPENDENT_AMBULATORY_CARE_PROVIDER_SITE_OTHER): Payer: Self-pay

## 2021-09-13 DIAGNOSIS — R509 Fever, unspecified: Secondary | ICD-10-CM | POA: Insufficient documentation

## 2021-09-13 DIAGNOSIS — M79631 Pain in right forearm: Secondary | ICD-10-CM | POA: Insufficient documentation

## 2021-09-13 DIAGNOSIS — M79601 Pain in right arm: Secondary | ICD-10-CM

## 2021-09-13 DIAGNOSIS — Z20822 Contact with and (suspected) exposure to covid-19: Secondary | ICD-10-CM | POA: Insufficient documentation

## 2021-09-13 DIAGNOSIS — R6889 Other general symptoms and signs: Secondary | ICD-10-CM

## 2021-09-13 DIAGNOSIS — G4700 Insomnia, unspecified: Secondary | ICD-10-CM | POA: Insufficient documentation

## 2021-09-13 LAB — POC INFLUENZA A AND B ANTIGEN (URGENT CARE ONLY)
INFLUENZA A ANTIGEN, POC: NEGATIVE
INFLUENZA B ANTIGEN, POC: NEGATIVE

## 2021-09-13 MED ORDER — GABAPENTIN 100 MG PO CAPS: 100.0000 mg | ORAL_CAPSULE | Freq: Every day | ORAL | 1 refills | Status: DC
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
## 2021-09-13 NOTE — Discharge Instructions (Addendum)
Your flu test was negative ? ?Take gabapentin 100 mg--start with 1 nightly.  If no effect for you pain, then increase to 2 nightly; can increase up to 3 nightly if still not much help.  This medication can cause sleepiness, dizziness, or hangover.   ? ?Take Tylenol or Advil as needed for pain or fever ? ? ?You have been swabbed for COVID, and the test will result in the next 24 hours. Our staff will call you if positive. If the test is positive, you should quarantine for 5 days.  ?
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
## 2021-09-14 LAB — SARS CORONAVIRUS 2 (TAT 6-24 HRS): SARS Coronavirus 2: NEGATIVE

## 2021-10-05 ENCOUNTER — Encounter: Payer: Self-pay | Admitting: Physical Medicine and Rehabilitation

## 2021-10-05 ENCOUNTER — Ambulatory Visit (INDEPENDENT_AMBULATORY_CARE_PROVIDER_SITE_OTHER): Payer: 59 | Admitting: Physical Medicine and Rehabilitation

## 2021-10-05 DIAGNOSIS — R202 Paresthesia of skin: Secondary | ICD-10-CM | POA: Diagnosis not present

## 2021-10-05 NOTE — Progress Notes (Signed)
Pt state right hand numbness. Pt state the numbness travrls from his right arm to his hands. Pt state he not able to move or hold items in his hand. Pt state he takes over the counter pain meds to help ease his pain. Pt state he is right handed.  ? ?Numeric Pain Rating Scale and Functional Assessment ?Average Pain 7 ? ? ?In the last MONTH (on 0-10 scale) has pain interfered with the following? ? ?1. General activity like being  able to carry out your everyday physical activities such as walking, climbing stairs, carrying groceries, or moving a chair?  ?Rating(10) ? ? -BT, -Dye Allergies. ? ?
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
## 2021-10-13 ENCOUNTER — Encounter: Payer: Self-pay | Admitting: Orthopedic Surgery

## 2021-10-13 ENCOUNTER — Ambulatory Visit (INDEPENDENT_AMBULATORY_CARE_PROVIDER_SITE_OTHER): Payer: 59 | Admitting: Orthopedic Surgery

## 2021-10-13 DIAGNOSIS — G5631 Lesion of radial nerve, right upper limb: Secondary | ICD-10-CM | POA: Diagnosis not present

## 2021-11-08 ENCOUNTER — Encounter: Payer: Self-pay | Admitting: *Deleted

## 2021-11-14 ENCOUNTER — Ambulatory Visit
Admission: EM | Admit: 2021-11-14 | Discharge: 2021-11-14 | Disposition: A | Discharge status: Home / Self Care | Payer: 59 | Attending: Emergency Medicine | Admitting: Emergency Medicine

## 2021-11-14 DIAGNOSIS — N4889 Other specified disorders of penis: Secondary | ICD-10-CM | POA: Diagnosis not present

## 2021-11-14 DIAGNOSIS — Z9189 Other specified personal risk factors, not elsewhere classified: Secondary | ICD-10-CM | POA: Insufficient documentation

## 2021-11-14 MED ORDER — CEFTRIAXONE SODIUM 500 MG IJ SOLR
500.0000 mg | Freq: Once | INTRAMUSCULAR | Status: AC
Start: 2021-11-14 — End: 2021-11-14
  Administered 2021-11-14: 500 mg via INTRAMUSCULAR

## 2021-11-14 NOTE — ED Triage Notes (Signed)
Pt c/o burning sensation in his penis, the patient denies having any other symptoms.  Started: 2 days ago
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
## 2021-11-14 NOTE — Discharge Instructions (Addendum)
Based on the history you provided to me today, you were treated empirically for gonorrhea with an injection of ceftriaxone 500 mg.  This is the only treatment you will need for gonorrhea.  Please abstain from sexual intercourse for 7 days.   The results of your STD testing which evaluates for gonorrhea, chlamydia and trichomonas will be made available to you once they are complete, this typically takes 3 to 5 days.  They will initially be posted to your MyChart and, if any of your results are abnormal, you will receive a phone call with those results along with further instructions regarding any further treatment, if needed.    Please remember that the only way to prevent transmission of sexually transmitted disease when having sexual intercourse is to use condoms.  Repeat sexually transmitted infections can cause scarring of the tubes that carry sperm from your testicles to your penis during ejaculation.  This can interfere with your your ability to have children.  Repeat exposures to sexually transmitted diseases can also increase your risk of contracting HIV as well as HPV, human papilloma virus which causes genital warts.   If you have not had complete resolution of your symptoms after completing treatment, please return for repeat evaluation.   Thank you for visiting urgent care today.  I appreciate the opportunity to participate in your care.

## 2021-11-16 ENCOUNTER — Ambulatory Visit (INDEPENDENT_AMBULATORY_CARE_PROVIDER_SITE_OTHER): Payer: 59 | Admitting: Orthopaedic Surgery

## 2021-11-16 DIAGNOSIS — G5631 Lesion of radial nerve, right upper limb: Secondary | ICD-10-CM

## 2021-11-16 LAB — CYTOLOGY, (ORAL, ANAL, URETHRAL) ANCILLARY ONLY
Chlamydia: NEGATIVE
Comment: NEGATIVE
Comment: NEGATIVE
Comment: NORMAL
Neisseria Gonorrhea: NEGATIVE
Trichomonas: NEGATIVE

## 2021-11-16 NOTE — Progress Notes (Signed)
Post Operative Evaluation    Procedure/Date of Surgery: 02/07/21  Interval History:   11/16/2021: Collin presents in the setting of a persistent wrist drop and PIN nerve palsy.  At today's visit he has predominantly lateral based epicondyle pain overall he states that he is now able to extend the wrist and do a mild bump.  He did recently have an EMG nerve conduction test which was consistent with ongoing reinnervation of the radial nerve.  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   Past Surgical History:  Procedure Laterality Date   NO PAST SURGERIES     ORIF HUMERUS FRACTURE Right 02/07/2021   Procedure: OPEN REDUCTION INTERNAL FIXATION (ORIF) DISTAL HUMERUS FRACTURE;  Surgeon: Huel Cote, MD;  Location: [facility] OR;  Service: Orthopedics;  Laterality: Right;   Social History   Socioeconomic History   Marital status: Married    Spouse name: Not on file   Number of children: Not on file   Years of education: Not on file   Highest education level: Not on file  Occupational History   Not on file  Tobacco Use   Smoking status: Every Day    Packs/day: 0.50    Types: Cigarettes   Smokeless tobacco: Never  Vaping Use   Vaping Use: Every day  Substance and Sexual Activity   Alcohol use: Yes    Comment: 8 beers per day   Drug use: Yes    Types: Marijuana   Sexual activity: Yes    Birth control/protection: None  Other Topics Concern   Not on file  Social History Narrative   ** Merged History Encounter **       Social Determinants of Health   Financial Resource Strain: Not on file  Food Insecurity: Not on file  Transportation Needs: Not on file  Physical Activity: Not on file  Stress: Not on file  Social Connections: Not on file   Family History  Problem Relation Age of Onset   Diabetes Mother     Hypertension Mother    Migraines Mother    No Known Allergies Current Outpatient Medications  Medication Sig Dispense Refill   aspirin EC 325 MG tablet Take 1 tablet (325 mg total) by mouth daily. 30 tablet 0   No current facility-administered medications for this visit.   No results found.  Review of Systems:   A ROS was performed including pertinent positives and negatives as documented in the HPI.   Musculoskeletal Exam:    There were no vitals taken for this visit.  Posterior incision about the right humerus is intact.  He is tenderness palpation over the lateral epicondyle.  He is now able to extend at the right wrist as well as weakly fire EPL.  Sensation is intact in the radial distribution.  Elbow range of motion is from 0 to 120 degrees without pain.  Full pro supination Imaging:     I personally reviewed and interpreted the radiographs.   Assessment:   46year-old male status post right distal humerus ORIF with a postoperative radial nerve palsy.  Fortunately at today's visit this appears to be resolving.  He is experiencing some ongoing lateral epicondylitis now which I do believe may be as  result of activating his wrist extensors for the first time in many months.  That effect I recommended a lateral epicondyle injection to help with his pain.  At this time I do not believe she necessarily needs hand follow-up given the fact that this is resolving quite well Plan :    -Return to clinic in 3 months for reassessment   I personally saw and evaluated the patient, and participated in the management and treatment plan.  Huel Cote, MD Attending Physician, Orthopedic Surgery  This document was dictated using Dragon voice recognition software. A reasonable attempt at proof reading has been made to minimize errors.
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
## 2022-01-12 ENCOUNTER — Ambulatory Visit (INDEPENDENT_AMBULATORY_CARE_PROVIDER_SITE_OTHER): Payer: No Typology Code available for payment source | Admitting: Orthopaedic Surgery

## 2022-01-12 ENCOUNTER — Other Ambulatory Visit (HOSPITAL_BASED_OUTPATIENT_CLINIC_OR_DEPARTMENT_OTHER): Payer: Self-pay

## 2022-01-12 DIAGNOSIS — G5631 Lesion of radial nerve, right upper limb: Secondary | ICD-10-CM | POA: Diagnosis not present

## 2022-01-12 MED ORDER — TRIAMCINOLONE ACETONIDE 40 MG/ML IJ SUSP
80.0000 mg | INTRAMUSCULAR | Status: AC | PRN
  Administered 2022-01-12: 80 mg via INTRA_ARTICULAR

## 2022-01-12 MED ORDER — LIDOCAINE HCL 1 % IJ SOLN
4.0000 mL | INTRAMUSCULAR | Status: AC | PRN
  Administered 2022-01-12: 4 mL

## 2022-01-12 MED ORDER — DICLOFENAC SODIUM 1 % EX GEL
4.0000 g | Freq: Three times a day (TID) | CUTANEOUS | 2 refills | Status: AC
  Filled 2022-01-12: qty 100, 30d supply, fill #0

## 2022-01-12 NOTE — Progress Notes (Signed)
Post Operative Evaluation    Procedure/Date of Surgery: 02/07/21  Interval History:   01/12/2022: Presents today for follow-up status post right humeral open reduction internal fixation.  Fortunately his strength with wrist extension continues to improve although he is not having neuropathic pain.  He was placed on gabapentin in the past although this gave him bad dreams so he stopped taking it.  He is here today complaining of predominantly pain in the elbow.  PMH/PSH/Family History/Social History/Meds/Allergies:    Past Medical History:  Diagnosis Date  . Medical history non-contributory   . 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   Past Surgical History:  Procedure Laterality Date  . NO PAST SURGERIES    . ORIF HUMERUS FRACTURE Right 02/07/2021   Procedure: OPEN REDUCTION INTERNAL FIXATION (ORIF) DISTAL HUMERUS FRACTURE;  Surgeon: Huel Cote, MD;  Location: [facility];  Service: Orthopedics;  Laterality: Right;   Social History   Socioeconomic History  . Marital status: Single    Spouse name: Not on file  . Number of children: Not on file  . Years of education: Not on file  . Highest education level: Not on file  Occupational History  . Not on file  Tobacco Use  . Smoking status: Every Day    Packs/day: 0.50    Types: Cigarettes  . Smokeless tobacco: Never  Vaping Use  . Vaping Use: Every day  Substance and Sexual Activity  . Alcohol use: Yes    Comment: 8 beers per day  . Drug use: Yes    Types: Marijuana  . Sexual activity: Yes    Birth control/protection: None  Other Topics Concern  . Not on file  Social History Narrative   ** Merged History Encounter **       Social Determinants of Health   Financial Resource Strain: Not on file  Food Insecurity: Not on file  Transportation Needs: Not on file  Physical Activity: Not on file  Stress: Not on file  Social Connections: Not on file   Family History  Problem Relation Age of  Onset  . Diabetes Mother   . Hypertension Mother   . Migraines Mother    No Known Allergies Current Outpatient Medications  Medication Sig Dispense Refill  . diclofenac Sodium (VOLTAREN) 1 % GEL Apply 4 g topically in the morning, at noon, and at bedtime. 100 g 2  . aspirin EC 325 MG tablet Take 1 tablet (325 mg total) by mouth daily. 30 tablet 0   No current facility-administered medications for this visit.   No results found.  Review of Systems:   A ROS was performed including pertinent positives and negatives as documented in the HPI.   Musculoskeletal Exam:    There were no vitals taken for this visit.  Posterior incision about the right humerus is intact.  He is tenderness palpation over elbow joint itself.  He is now able to extend at the right wrist as well as weakly fire EPL.  Sensation is intact in the radial distribution.  Elbow range of motion is from 0 to 120 degrees without pain.  Full pro supination Imaging:     I personally reviewed and interpreted the radiographs.   Assessment:   46year-old male status post right distal humerus ORIF with a postoperative radial nerve palsy.  Fortunately his radial  nerve palsy is improving although he is not having neuropathic pain.  Given the fact that he was unable to tolerate gabapentin I will plan to prescribe Voltaren gel that he can apply topically.  I also recommended a right elbow ultrasound-guided injection in order to hopefully get him some elbow relief from periprosthetic pain. Plan :    -Right ultrasound-guided elbow injection performed after verbal consent was obtained    Procedure Note  Patient: Louis Thompson             Date of Birth: [birth year]           MRN: [id number]             Visit Date: 01/12/2022  Procedures: Visit Diagnoses: No diagnosis found.  Medium Joint Inj: R elbow on 01/12/2022 10:38 AM Indications: pain Details: 22 G 1.5 in needle, ultrasound-guided lateral approach Medications: 4 mL  lidocaine 1 %; 80 mg triamcinolone acetonide 40 MG/ML Outcome: tolerated well, no immediate complications Consent was given by the patient. Immediately prior to procedure a time out was called to verify the correct patient, procedure, equipment, support staff and site/side marked as required. Patient was prepped and draped in the usual sterile fashion.       I personally saw and evaluated the patient, and participated in the management and treatment plan.  Huel Cote, MD Attending Physician, Orthopedic Surgery  This document was dictated using Dragon voice recognition software. A reasonable attempt at proof reading has been made to minimize errors.

## 2022-01-26 ENCOUNTER — Other Ambulatory Visit (HOSPITAL_BASED_OUTPATIENT_CLINIC_OR_DEPARTMENT_OTHER): Payer: Self-pay

## 2022-02-15 ENCOUNTER — Ambulatory Visit (HOSPITAL_BASED_OUTPATIENT_CLINIC_OR_DEPARTMENT_OTHER): Payer: 59 | Admitting: Orthopaedic Surgery

## 2022-02-20 ENCOUNTER — Ambulatory Visit (INDEPENDENT_AMBULATORY_CARE_PROVIDER_SITE_OTHER): Payer: No Typology Code available for payment source | Admitting: Orthopaedic Surgery

## 2022-02-20 DIAGNOSIS — G5631 Lesion of radial nerve, right upper limb: Secondary | ICD-10-CM

## 2022-02-20 DIAGNOSIS — S42411D Displaced simple supracondylar fracture without intercondylar fracture of right humerus, subsequent encounter for fracture with routine healing: Secondary | ICD-10-CM | POA: Diagnosis not present

## 2022-02-20 NOTE — Progress Notes (Signed)
Post Operative Evaluation     Interval History:   02/20/2022: Patient presents today for follow-up status post right elbow steroid injection.  He continues to improve from a radial nerve palsy following ORIF of the humerus.  He is experiencing neuropathic pain with reinnervation.  Overall he continues to improve and feel better.  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   Past Surgical History:  Procedure Laterality Date   NO PAST SURGERIES     ORIF HUMERUS FRACTURE Right 02/07/2021   Procedure: OPEN REDUCTION INTERNAL FIXATION (ORIF) DISTAL HUMERUS FRACTURE;  Surgeon: Vanetta Mulders, MD;  Location: [place];  Service: Orthopedics;  Laterality: Right;   Social History   Socioeconomic History   Marital status: Single    Spouse name: Not on file   Number of children: Not on file   Years of education: Not on file   Highest education level: Not on file  Occupational History   Not on file  Tobacco Use   Smoking status: Every Day    Packs/day: 0.50    Types: Cigarettes   Smokeless tobacco: Never  Vaping Use   Vaping Use: Every day  Substance and Sexual Activity   Alcohol use: Yes    Comment: 8 beers per day   Drug use: Yes    Types: Marijuana   Sexual activity: Yes    Birth control/protection: None  Other Topics Concern   Not on file  Social History Narrative   ** Merged History Encounter **       Social Determinants of Health   Financial Resource Strain: Not on file  Food Insecurity: Not on file  Transportation Needs: Not on file  Physical Activity: Not on file  Stress: Not on file  Social Connections: Not on file   Family History  Problem Relation Age of Onset   Diabetes Mother    Hypertension Mother    Migraines Mother    No Known Allergies Current Outpatient Medications  Medication Sig Dispense  Refill   aspirin EC 325 MG tablet Take 1 tablet (325 mg total) by mouth daily. 30 tablet 0   diclofenac Sodium (VOLTAREN) 1 % GEL Apply 4 g topically in the morning, at noon, and at bedtime. 100 g 2   No current facility-administered medications for this visit.   No results found.  Review of Systems:   A ROS was performed including pertinent positives and negatives as documented in the HPI.   Musculoskeletal Exam:    There were no vitals taken for this visit.  Posterior incision about the right humerus is intact.  No tenderness about the elbow.  He is now able to extend at the right wrist as well as weakly fire EPL.  Sensation is intact in the radial distribution.  Elbow range of motion is from 0 to 120 degrees without pain.  Full pro supination Imaging:     I personally reviewed and interpreted the radiographs.   Assessment:   46 year-old male status post right distal humerus ORIF with a postoperative radial nerve palsy.  Overall he does continue to improve.  Pain is much better at today's visit.  We will plan to return to clinic as needed Plan :    -  Return to clinic as needed   I personally saw and evaluated the patient, and participated in the management and treatment plan.  Vanetta Mulders, MD Attending Physician, Orthopedic Surgery  This document was dictated using Dragon voice recognition software. A reasonable attempt at proof reading has been made to minimize errors.

## 2022-04-07 ENCOUNTER — Ambulatory Visit (HOSPITAL_BASED_OUTPATIENT_CLINIC_OR_DEPARTMENT_OTHER): Payer: No Typology Code available for payment source | Admitting: Orthopaedic Surgery

## 2022-04-10 ENCOUNTER — Ambulatory Visit (INDEPENDENT_AMBULATORY_CARE_PROVIDER_SITE_OTHER): Payer: No Typology Code available for payment source | Admitting: Orthopaedic Surgery

## 2022-04-10 DIAGNOSIS — S42411D Displaced simple supracondylar fracture without intercondylar fracture of right humerus, subsequent encounter for fracture with routine healing: Secondary | ICD-10-CM

## 2022-04-10 MED ORDER — LIDOCAINE 5 % EX PTCH: 1.0000 | MEDICATED_PATCH | CUTANEOUS | 0 refills | Status: AC

## 2022-04-10 NOTE — Progress Notes (Signed)
Post Operative Evaluation     Interval History:   04/10/2022: Presents today for follow-up of his right arm.  He is experiencing soreness predominantly about the triceps.  Denies any numbness in the hand at this point.   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   Past Surgical History:  Procedure Laterality Date   NO PAST SURGERIES     ORIF HUMERUS FRACTURE Right 02/07/2021   Procedure: OPEN REDUCTION INTERNAL FIXATION (ORIF) DISTAL HUMERUS FRACTURE;  Surgeon: Huel Cote, MD;  Location: [facility];  Service: Orthopedics;  Laterality: Right;   Social History   Socioeconomic History   Marital status: Single    Spouse name: Not on file   Number of children: Not on file   Years of education: Not on file   Highest education level: Not on file  Occupational History   Not on file  Tobacco Use   Smoking status: Every Day    Packs/day: 0.50    Types: Cigarettes   Smokeless tobacco: Never  Vaping Use   Vaping Use: Every day  Substance and Sexual Activity   Alcohol use: Yes    Comment: 8 beers per day   Drug use: Yes    Types: Marijuana   Sexual activity: Yes    Birth control/protection: None  Other Topics Concern   Not on file  Social History Narrative   ** Merged History Encounter **       Social Determinants of Health   Financial Resource Strain: Not on file  Food Insecurity: Not on file  Transportation Needs: Not on file  Physical Activity: Not on file  Stress: Not on file  Social Connections: Not on file   Family History  Problem Relation Age of Onset   Diabetes Mother    Hypertension Mother    Migraines Mother    No Known Allergies Current Outpatient Medications  Medication Sig Dispense Refill   aspirin EC 325 MG tablet Take 1 tablet (325 mg total) by mouth daily. 30 tablet 0   diclofenac  Sodium (VOLTAREN) 1 % GEL Apply 4 g topically in the morning, at noon, and at bedtime. 100 g 2   No current facility-administered medications for this visit.   No results found.  Review of Systems:   A ROS was performed including pertinent positives and negatives as documented in the HPI.   Musculoskeletal Exam:    There were no vitals taken for this visit.  Posterior incision about the right humerus is intact.  No tenderness about the elbow.  He is now able to extend at the right wrist as well as weakly fire EPL.  Sensation is intact in the radial distribution.  Elbow range of motion is from 0 to 120 degrees without pain.  Full pro supination Imaging:     I personally reviewed and interpreted the radiographs.   Assessment:   46 year-old male status post right distal humerus ORIF with a postoperative radial nerve palsy.  Overall he does continue to improve.  Today's pain is more consistent with a remodeling type bony pain versus hardware irritation.  I did describe that hardware removal would be quite extensive procedure for him particular as there is likely scar tissue  around his nerve.  Side effect we will plan to start with lidocaine patches about the triceps Plan :    -Return to clinic as needed   I personally saw and evaluated the patient, and participated in the management and treatment plan.  Vanetta Mulders, MD Attending Physician, Orthopedic Surgery  This document was dictated using Dragon voice recognition software. A reasonable attempt at proof reading has been made to minimize errors.

## 2022-05-19 ENCOUNTER — Encounter (HOSPITAL_BASED_OUTPATIENT_CLINIC_OR_DEPARTMENT_OTHER): Payer: Self-pay | Admitting: Orthopaedic Surgery

## 2022-08-01 ENCOUNTER — Telehealth: Payer: Self-pay

## 2022-08-01 NOTE — Telephone Encounter (Signed)
This was left on my voice mail (for unknown reason):  The patient was given a Lidoderm patch Rx in November of 2023. That Rx required a prior authorization. The patient is trying to get this refilled --- she wants to know if a new prior Louis Thompson has been obtained, or if it has been denied. Marlowe Kays is not in the office today, but will be in the office on 2/28. She said you could leave a voice mail, though.
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
## 2022-08-02 ENCOUNTER — Other Ambulatory Visit (HOSPITAL_BASED_OUTPATIENT_CLINIC_OR_DEPARTMENT_OTHER): Payer: Self-pay | Admitting: Orthopaedic Surgery

## 2022-08-02 ENCOUNTER — Encounter (HOSPITAL_BASED_OUTPATIENT_CLINIC_OR_DEPARTMENT_OTHER): Payer: Self-pay | Admitting: Orthopaedic Surgery

## 2022-08-11 ENCOUNTER — Telehealth: Payer: Self-pay | Admitting: Orthopaedic Surgery

## 2022-08-11 ENCOUNTER — Other Ambulatory Visit (HOSPITAL_BASED_OUTPATIENT_CLINIC_OR_DEPARTMENT_OTHER): Payer: Self-pay | Admitting: Orthopaedic Surgery

## 2022-08-11 MED ORDER — DICLOFENAC SODIUM 1 % EX GEL: 4.0000 g | Freq: Four times a day (QID) | CUTANEOUS | 3 refills | Status: AC

## 2022-08-11 NOTE — Telephone Encounter (Signed)
Patient notified

## 2022-08-11 NOTE — Telephone Encounter (Signed)
[facility] called stating [facility] do not have patients Rx it was lost and he can get if we send the Rx back over to them Voltaren
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
## 2022-10-18 ENCOUNTER — Ambulatory Visit (HOSPITAL_BASED_OUTPATIENT_CLINIC_OR_DEPARTMENT_OTHER): Payer: No Typology Code available for payment source | Admitting: Orthopaedic Surgery

## 2022-10-18 DIAGNOSIS — S42411D Displaced simple supracondylar fracture without intercondylar fracture of right humerus, subsequent encounter for fracture with routine healing: Secondary | ICD-10-CM

## 2022-10-18 NOTE — Progress Notes (Signed)
Post Operative Evaluation     Interval History:   10/18/2022: Presents today for follow-up of his right arm.  At this time he is experiencing pins and tingling in the radial distribution with some numbness in the back of the thumb.  Overall his nerve palsy is recovering nicely.   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   Past Surgical History:  Procedure Laterality Date   NO PAST SURGERIES     ORIF HUMERUS FRACTURE Right 02/07/2021   Procedure: OPEN REDUCTION INTERNAL FIXATION (ORIF) DISTAL HUMERUS FRACTURE;  Surgeon: Huel Cote, MD;  Location: [facility];  Service: Orthopedics;  Laterality: Right;   Social History   Socioeconomic History   Marital status: Single    Spouse name: Not on file   Number of children: Not on file   Years of education: Not on file   Highest education level: Not on file  Occupational History   Not on file  Tobacco Use   Smoking status: Every Day    Packs/day: .5    Types: Cigarettes   Smokeless tobacco: Never  Vaping Use   Vaping Use: Every day  Substance and Sexual Activity   Alcohol use: Yes    Comment: 8 beers per day   Drug use: Yes    Types: Marijuana   Sexual activity: Yes    Birth control/protection: None  Other Topics Concern   Not on file  Social History Narrative   ** Merged History Encounter **       Social Determinants of Health   Financial Resource Strain: Not on file  Food Insecurity: Not on file  Transportation Needs: Not on file  Physical Activity: Not on file  Stress: Not on file  Social Connections: Not on file   Family History  Problem Relation Age of Onset   Diabetes Mother    Hypertension Mother    Migraines Mother    No Known Allergies Current Outpatient Medications  Medication Sig Dispense Refill   aspirin EC 325 MG tablet Take 1 tablet  (325 mg total) by mouth daily. 30 tablet 0   diclofenac Sodium (VOLTAREN) 1 % GEL Apply 4 g topically in the morning, at noon, and at bedtime. 100 g 2   diclofenac Sodium (VOLTAREN) 1 % GEL Apply 4 g topically 4 (four) times daily. 50 g 3   lidocaine (LIDODERM) 5 % Place 1 patch onto the skin daily. Remove & Discard patch within 12 hours or as directed by MD 30 patch 0   No current facility-administered medications for this visit.   No results found.  Review of Systems:   A ROS was performed including pertinent positives and negatives as documented in the HPI.   Musculoskeletal Exam:    There were no vitals taken for this visit.  Posterior incision about the right humerus is intact.  No tenderness about the elbow.  He is now able to extend at the right wrist as well as weakly fire EPL.  Sensation is intact in the radial distribution.  Elbow range of motion is from 0 to 120 degrees without pain.  Full pro supination Imaging:     I personally reviewed and interpreted the radiographs.   Assessment:  47 year-old male status post right distal humerus ORIF with a postoperative radial nerve palsy.  Overall he does continue to improve.  Overall this is improving in terms of strength and sensation but he does appear to have some neuropathic type pain.  I have described that overall I would like to refer him to my partner Dr. Shon Baton to see if he would be a candidate for a Lyrica type medication as I do not necessarily have comfortable prescribing these.  Will plan to make this referral and I will see him back as needed Plan :    -Return to clinic as needed   I personally saw and evaluated the patient, and participated in the management and treatment plan.  Huel Cote, MD Attending Physician, Orthopedic Surgery  This document was dictated using Dragon voice recognition software. A reasonable attempt at proof reading has been made to minimize errors.

## 2022-11-01 ENCOUNTER — Ambulatory Visit: Payer: No Typology Code available for payment source | Admitting: Sports Medicine

## 2022-11-07 ENCOUNTER — Encounter: Payer: Self-pay | Admitting: Sports Medicine

## 2022-11-07 ENCOUNTER — Ambulatory Visit (INDEPENDENT_AMBULATORY_CARE_PROVIDER_SITE_OTHER): Payer: No Typology Code available for payment source | Admitting: Sports Medicine

## 2022-11-07 DIAGNOSIS — S42411S Displaced simple supracondylar fracture without intercondylar fracture of right humerus, sequela: Secondary | ICD-10-CM

## 2022-11-07 DIAGNOSIS — G5631 Lesion of radial nerve, right upper limb: Secondary | ICD-10-CM

## 2022-11-07 MED ORDER — VITAMIN B-6 100 MG PO TABS: 100.0000 mg | ORAL_TABLET | Freq: Every day | ORAL | 1 refills | Status: AC

## 2022-11-07 MED ORDER — PREGABALIN 75 MG PO CAPS: 75.0000 mg | ORAL_CAPSULE | Freq: Two times a day (BID) | ORAL | 1 refills | Status: AC
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
## 2022-11-14 ENCOUNTER — Ambulatory Visit: Payer: No Typology Code available for payment source | Attending: Sports Medicine | Admitting: Occupational Therapy

## 2022-11-14 DIAGNOSIS — R208 Other disturbances of skin sensation: Secondary | ICD-10-CM | POA: Insufficient documentation

## 2022-11-14 DIAGNOSIS — M6281 Muscle weakness (generalized): Secondary | ICD-10-CM | POA: Diagnosis present

## 2022-11-14 DIAGNOSIS — G5631 Lesion of radial nerve, right upper limb: Secondary | ICD-10-CM | POA: Insufficient documentation

## 2022-11-14 DIAGNOSIS — R278 Other lack of coordination: Secondary | ICD-10-CM | POA: Diagnosis present

## 2022-11-21 ENCOUNTER — Ambulatory Visit: Payer: No Typology Code available for payment source | Admitting: Occupational Therapy

## 2022-11-21 DIAGNOSIS — R208 Other disturbances of skin sensation: Secondary | ICD-10-CM

## 2022-11-21 DIAGNOSIS — R278 Other lack of coordination: Secondary | ICD-10-CM

## 2022-11-21 DIAGNOSIS — M6281 Muscle weakness (generalized): Secondary | ICD-10-CM | POA: Diagnosis not present
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
## 2022-11-21 NOTE — Patient Instructions (Signed)
  Coordination Activities  Perform the following activities for 20 minutes 1 times per day with right hand(s).  Rotate ball in fingertips (clockwise and counter-clockwise). Toss ball between hands. Toss ball in air and catch with the same hand. Flip cards 1 at a time as fast as you can. Deal cards with your thumb (Hold deck in hand and push card off top with thumb). Pick up coins and place in container or coin bank. Pick up coins and stack, then pick up the stack and place in a container   1. Grip Strengthening (Resistive Putty)   Squeeze putty using thumb and all fingers. Repeat _20___ times. Do __2__ sessions per day.   2. Roll putty into tube on table and pinch between each  finger and thumb x 10 reps. Do 2 sessions per day     Copyright  VHI. All rights reserved.

## 2022-11-28 ENCOUNTER — Ambulatory Visit: Payer: No Typology Code available for payment source | Admitting: Occupational Therapy

## 2022-11-28 DIAGNOSIS — R208 Other disturbances of skin sensation: Secondary | ICD-10-CM

## 2022-11-28 DIAGNOSIS — M6281 Muscle weakness (generalized): Secondary | ICD-10-CM | POA: Diagnosis not present

## 2022-11-28 DIAGNOSIS — R278 Other lack of coordination: Secondary | ICD-10-CM
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
## 2022-11-28 NOTE — Patient Instructions (Addendum)
Opposition (Active)   Touch tip of thumb to nail tip of each finger in turn, making an "O" shape. Repeat __10__ times. Do _3___ sessions per day.   AROM: Thumb Abduction / Adduction     MP Flexion (Active)   Bend thumb to touch base of little finger, keeping tip joint straight. Repeat __10-15__ times. Do _3___ sessions per day              ROM: Abduction - Wand   Holding wand with left hand palm up, push wand directly out to side, leading with other hand palm down, until stretch is felt. Hold 5 seconds. Repeat 10 times per set. Do 2-3 sessions per day.        IT trainer - Standing   Copyright  VHI. All rights reserved.   With arms straight, hold cane forward at waist. Raise cane to shoulder height.  Hold 3 seconds. Repeat 10 times. Do 2 times per day.   Hold cane with both hands. Bend and straighten elbows. Hold __5_ seconds. Use no weight on cane. __10_ reps per set, 1-2___ sets per day, _7_ days per week    SCAPULA: Retraction/ chest press    Hold cane with both hands. Pinch shoulder blades together. Do not shrug shoulders. Hold _5__ seconds.  _10__ reps per set, __1-2_ sets per day, _7__ days per week  Copyright  VHI. All rights reserved.

## 2022-12-04 ENCOUNTER — Ambulatory Visit: Payer: No Typology Code available for payment source | Attending: Sports Medicine | Admitting: Occupational Therapy

## 2022-12-04 ENCOUNTER — Encounter: Payer: Self-pay | Admitting: Occupational Therapy

## 2022-12-04 DIAGNOSIS — R278 Other lack of coordination: Secondary | ICD-10-CM | POA: Diagnosis present

## 2022-12-04 DIAGNOSIS — M6281 Muscle weakness (generalized): Secondary | ICD-10-CM | POA: Insufficient documentation

## 2022-12-04 DIAGNOSIS — R208 Other disturbances of skin sensation: Secondary | ICD-10-CM | POA: Insufficient documentation
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
## 2022-12-12 ENCOUNTER — Ambulatory Visit: Payer: No Typology Code available for payment source | Admitting: Occupational Therapy
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
## 2022-12-18 ENCOUNTER — Ambulatory Visit: Payer: No Typology Code available for payment source | Admitting: Occupational Therapy
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
## 2022-12-19 ENCOUNTER — Ambulatory Visit: Payer: No Typology Code available for payment source | Admitting: Sports Medicine

## 2022-12-19 ENCOUNTER — Ambulatory Visit: Payer: No Typology Code available for payment source | Admitting: Occupational Therapy

## 2022-12-19 ENCOUNTER — Encounter: Payer: Self-pay | Admitting: Sports Medicine

## 2022-12-19 DIAGNOSIS — G5631 Lesion of radial nerve, right upper limb: Secondary | ICD-10-CM | POA: Diagnosis not present

## 2022-12-19 DIAGNOSIS — S42411S Displaced simple supracondylar fracture without intercondylar fracture of right humerus, sequela: Secondary | ICD-10-CM

## 2022-12-19 DIAGNOSIS — R278 Other lack of coordination: Secondary | ICD-10-CM

## 2022-12-19 DIAGNOSIS — R208 Other disturbances of skin sensation: Secondary | ICD-10-CM

## 2022-12-19 DIAGNOSIS — M6281 Muscle weakness (generalized): Secondary | ICD-10-CM | POA: Diagnosis not present
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
## 2022-12-19 NOTE — Progress Notes (Signed)
Not doing much better;  States he is doing therapy every week Just started taking prescribed medication; states he takes it every other day
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
## 2022-12-19 NOTE — Patient Instructions (Addendum)
  Strengthening: Resisted Flexion   Hold tubing with _right____ arm(s) at side. Pull forward and up. Move shoulder through pain-free range of motion. Repeat __10__ times per set.  Do _1-2_ sessions per day , every other day   Strengthening: Resisted Extension   Hold tubing in ___right__ hand(s), arm forward. Pull arm back, elbow straight. Repeat _10___ times per set. Do _1-2___ sessions per day, every other day.   Resisted Horizontal Abduction: Bilateral   Sit or stand, tubing in both hands, arms out in front. Keeping arms straight, pinch shoulder blades together and stretch arms out. Repeat _10___ times per set. Do _1-2___ sessions per day, every other day.   Elbow Flexion: Resisted   With tubing held in __right____ hand(s) and other end secured under foot, curl arm up as far as possible. Repeat _10___ times per set. Do _1-2___ sessions per day, every other day.    Elbow Extension: Resisted   Sit in chair with resistive band hold with other hand) and right elbow bent. Straighten elbow. Repeat _10___ times per set.  Do _1-2___ sessions per day, every other day.   Copyright  VHI. All rights reserved.    Shoulder Row: Sitting    Face anchor. Palms down, pull elbows back, squeezing shoulder blades together. Repeat _10_ times per set. Do _1_ sets per session. Do _1_ sessions per week. Anchor Height: Chest  [email or web address].[email or web address]   Copyright  VHI. All rights reserved.

## 2022-12-26 ENCOUNTER — Ambulatory Visit: Payer: No Typology Code available for payment source | Admitting: Occupational Therapy

## 2022-12-26 DIAGNOSIS — M6281 Muscle weakness (generalized): Secondary | ICD-10-CM

## 2022-12-26 DIAGNOSIS — R278 Other lack of coordination: Secondary | ICD-10-CM

## 2022-12-26 DIAGNOSIS — R208 Other disturbances of skin sensation: Secondary | ICD-10-CM
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
## 2023-01-02 ENCOUNTER — Telehealth: Payer: Self-pay | Admitting: Orthopaedic Surgery

## 2023-01-02 NOTE — Telephone Encounter (Signed)
Pt called in stating he needs a work note stating that he cannot work still please advise

## 2023-01-03 ENCOUNTER — Encounter (HOSPITAL_BASED_OUTPATIENT_CLINIC_OR_DEPARTMENT_OTHER): Payer: Self-pay | Admitting: Orthopaedic Surgery

## 2023-01-04 ENCOUNTER — Ambulatory Visit: Payer: Medicaid Other | Admitting: Occupational Therapy

## 2023-01-05 ENCOUNTER — Other Ambulatory Visit (HOSPITAL_BASED_OUTPATIENT_CLINIC_OR_DEPARTMENT_OTHER): Payer: Self-pay | Admitting: Orthopaedic Surgery

## 2023-01-05 DIAGNOSIS — G5631 Lesion of radial nerve, right upper limb: Secondary | ICD-10-CM

## 2023-01-16 ENCOUNTER — Ambulatory Visit: Payer: Medicaid Other | Admitting: Occupational Therapy
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
## 2023-01-19 ENCOUNTER — Ambulatory Visit: Payer: No Typology Code available for payment source | Admitting: Sports Medicine

## 2023-01-29 ENCOUNTER — Ambulatory Visit (HOSPITAL_BASED_OUTPATIENT_CLINIC_OR_DEPARTMENT_OTHER): Payer: No Typology Code available for payment source | Admitting: Physical Therapy

## 2023-01-30 ENCOUNTER — Encounter (HOSPITAL_BASED_OUTPATIENT_CLINIC_OR_DEPARTMENT_OTHER): Payer: Self-pay | Admitting: Physical Therapy

## 2023-01-30 ENCOUNTER — Ambulatory Visit (HOSPITAL_BASED_OUTPATIENT_CLINIC_OR_DEPARTMENT_OTHER): Payer: No Typology Code available for payment source | Attending: Orthopaedic Surgery | Admitting: Physical Therapy

## 2023-01-30 ENCOUNTER — Other Ambulatory Visit: Payer: Self-pay

## 2023-01-30 DIAGNOSIS — M79601 Pain in right arm: Secondary | ICD-10-CM | POA: Diagnosis present

## 2023-01-30 DIAGNOSIS — G5631 Lesion of radial nerve, right upper limb: Secondary | ICD-10-CM | POA: Diagnosis not present

## 2023-01-30 DIAGNOSIS — R208 Other disturbances of skin sensation: Secondary | ICD-10-CM | POA: Diagnosis present

## 2023-01-30 DIAGNOSIS — M6281 Muscle weakness (generalized): Secondary | ICD-10-CM
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
## 2023-02-13 ENCOUNTER — Ambulatory Visit (HOSPITAL_BASED_OUTPATIENT_CLINIC_OR_DEPARTMENT_OTHER): Payer: Medicaid Other | Attending: Orthopaedic Surgery | Admitting: Physical Therapy

## 2023-02-13 DIAGNOSIS — R278 Other lack of coordination: Secondary | ICD-10-CM | POA: Insufficient documentation

## 2023-02-13 DIAGNOSIS — M6281 Muscle weakness (generalized): Secondary | ICD-10-CM | POA: Insufficient documentation

## 2023-02-13 DIAGNOSIS — M79601 Pain in right arm: Secondary | ICD-10-CM | POA: Insufficient documentation

## 2023-02-13 DIAGNOSIS — R208 Other disturbances of skin sensation: Secondary | ICD-10-CM | POA: Insufficient documentation

## 2023-02-21 ENCOUNTER — Encounter (HOSPITAL_BASED_OUTPATIENT_CLINIC_OR_DEPARTMENT_OTHER): Payer: Self-pay | Admitting: Physical Therapy

## 2023-02-21 ENCOUNTER — Ambulatory Visit (HOSPITAL_BASED_OUTPATIENT_CLINIC_OR_DEPARTMENT_OTHER): Payer: No Typology Code available for payment source | Admitting: Physical Therapy

## 2023-02-21 DIAGNOSIS — M79601 Pain in right arm: Secondary | ICD-10-CM | POA: Diagnosis present

## 2023-02-21 DIAGNOSIS — R208 Other disturbances of skin sensation: Secondary | ICD-10-CM | POA: Diagnosis present

## 2023-02-21 DIAGNOSIS — M6281 Muscle weakness (generalized): Secondary | ICD-10-CM | POA: Diagnosis present

## 2023-02-21 DIAGNOSIS — R278 Other lack of coordination: Secondary | ICD-10-CM | POA: Diagnosis present
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
## 2023-02-28 ENCOUNTER — Ambulatory Visit (HOSPITAL_BASED_OUTPATIENT_CLINIC_OR_DEPARTMENT_OTHER): Payer: No Typology Code available for payment source | Admitting: Physical Therapy

## 2023-03-07 ENCOUNTER — Ambulatory Visit (HOSPITAL_BASED_OUTPATIENT_CLINIC_OR_DEPARTMENT_OTHER): Payer: No Typology Code available for payment source | Attending: Orthopaedic Surgery | Admitting: Physical Therapy

## 2023-03-07 ENCOUNTER — Encounter (HOSPITAL_BASED_OUTPATIENT_CLINIC_OR_DEPARTMENT_OTHER): Payer: Self-pay | Admitting: Physical Therapy

## 2023-03-07 DIAGNOSIS — M6281 Muscle weakness (generalized): Secondary | ICD-10-CM | POA: Diagnosis present

## 2023-03-07 DIAGNOSIS — M79601 Pain in right arm: Secondary | ICD-10-CM

## 2023-03-07 DIAGNOSIS — R278 Other lack of coordination: Secondary | ICD-10-CM | POA: Diagnosis present

## 2023-03-07 DIAGNOSIS — R208 Other disturbances of skin sensation: Secondary | ICD-10-CM
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
## 2023-03-14 ENCOUNTER — Ambulatory Visit (HOSPITAL_BASED_OUTPATIENT_CLINIC_OR_DEPARTMENT_OTHER): Payer: No Typology Code available for payment source | Admitting: Physical Therapy

## 2023-03-19 ENCOUNTER — Ambulatory Visit (HOSPITAL_BASED_OUTPATIENT_CLINIC_OR_DEPARTMENT_OTHER): Payer: No Typology Code available for payment source | Admitting: Orthopaedic Surgery

## 2023-03-21 ENCOUNTER — Ambulatory Visit (HOSPITAL_BASED_OUTPATIENT_CLINIC_OR_DEPARTMENT_OTHER): Payer: No Typology Code available for payment source | Admitting: Physical Therapy

## 2023-05-13 ENCOUNTER — Encounter (HOSPITAL_BASED_OUTPATIENT_CLINIC_OR_DEPARTMENT_OTHER): Payer: Self-pay | Admitting: Emergency Medicine

## 2023-05-13 ENCOUNTER — Emergency Department (HOSPITAL_BASED_OUTPATIENT_CLINIC_OR_DEPARTMENT_OTHER)
Admission: EM | Admit: 2023-05-13 | Discharge: 2023-05-13 | Disposition: A | Discharge status: Home / Self Care | Payer: No Typology Code available for payment source | Attending: Emergency Medicine | Admitting: Emergency Medicine

## 2023-05-13 ENCOUNTER — Other Ambulatory Visit: Payer: Self-pay

## 2023-05-13 ENCOUNTER — Emergency Department (HOSPITAL_BASED_OUTPATIENT_CLINIC_OR_DEPARTMENT_OTHER): Payer: No Typology Code available for payment source

## 2023-05-13 DIAGNOSIS — M25521 Pain in right elbow: Secondary | ICD-10-CM | POA: Insufficient documentation

## 2023-05-13 DIAGNOSIS — R531 Weakness: Secondary | ICD-10-CM | POA: Diagnosis not present

## 2023-05-13 DIAGNOSIS — Z7982 Long term (current) use of aspirin: Secondary | ICD-10-CM | POA: Insufficient documentation

## 2023-05-13 MED ORDER — OXYCODONE-ACETAMINOPHEN 5-325 MG PO TABS
1.0000 | ORAL_TABLET | Freq: Once | ORAL | Status: AC
  Administered 2023-05-13: 1 via ORAL
  Filled 2023-05-13: qty 1

## 2023-05-13 MED ORDER — OXYCODONE-ACETAMINOPHEN 5-325 MG PO TABS: 1.0000 | ORAL_TABLET | ORAL | 0 refills | Status: DC | PRN
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
## 2023-06-11 ENCOUNTER — Ambulatory Visit (HOSPITAL_BASED_OUTPATIENT_CLINIC_OR_DEPARTMENT_OTHER): Payer: No Typology Code available for payment source | Admitting: Orthopaedic Surgery

## 2023-06-11 DIAGNOSIS — R202 Paresthesia of skin: Secondary | ICD-10-CM

## 2023-06-11 DIAGNOSIS — G5631 Lesion of radial nerve, right upper limb: Secondary | ICD-10-CM | POA: Diagnosis not present
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
## 2023-06-12 ENCOUNTER — Encounter (HOSPITAL_BASED_OUTPATIENT_CLINIC_OR_DEPARTMENT_OTHER): Payer: Self-pay | Admitting: Orthopaedic Surgery

## 2023-06-14 NOTE — Telephone Encounter (Signed)
 Referral has been faxed to number given they will contact pt to scheudle appt

## 2023-08-06 ENCOUNTER — Ambulatory Visit (HOSPITAL_BASED_OUTPATIENT_CLINIC_OR_DEPARTMENT_OTHER): Payer: Self-pay | Admitting: Orthopaedic Surgery

## 2023-08-20 ENCOUNTER — Ambulatory Visit (HOSPITAL_BASED_OUTPATIENT_CLINIC_OR_DEPARTMENT_OTHER): Payer: Self-pay | Admitting: Orthopaedic Surgery

## 2023-11-13 ENCOUNTER — Encounter: Payer: Self-pay | Admitting: *Deleted

## 2024-01-15 ENCOUNTER — Encounter (HOSPITAL_BASED_OUTPATIENT_CLINIC_OR_DEPARTMENT_OTHER): Payer: Self-pay | Admitting: Urology

## 2024-01-15 ENCOUNTER — Emergency Department (HOSPITAL_BASED_OUTPATIENT_CLINIC_OR_DEPARTMENT_OTHER)

## 2024-01-15 ENCOUNTER — Emergency Department (HOSPITAL_BASED_OUTPATIENT_CLINIC_OR_DEPARTMENT_OTHER): Admission: EM | Admit: 2024-01-15 | Discharge: 2024-01-15 | Disposition: A | Discharge status: Home / Self Care

## 2024-01-15 ENCOUNTER — Other Ambulatory Visit: Payer: Self-pay

## 2024-01-15 ENCOUNTER — Other Ambulatory Visit (HOSPITAL_BASED_OUTPATIENT_CLINIC_OR_DEPARTMENT_OTHER): Payer: Self-pay

## 2024-01-15 DIAGNOSIS — I2510 Atherosclerotic heart disease of native coronary artery without angina pectoris: Secondary | ICD-10-CM | POA: Insufficient documentation

## 2024-01-15 DIAGNOSIS — Z7982 Long term (current) use of aspirin: Secondary | ICD-10-CM | POA: Diagnosis not present

## 2024-01-15 DIAGNOSIS — F17210 Nicotine dependence, cigarettes, uncomplicated: Secondary | ICD-10-CM | POA: Diagnosis not present

## 2024-01-15 DIAGNOSIS — M25561 Pain in right knee: Secondary | ICD-10-CM | POA: Diagnosis present

## 2024-01-15 MED ORDER — KETOROLAC TROMETHAMINE 30 MG/ML IJ SOLN
30.0000 mg | Freq: Once | INTRAMUSCULAR | Status: AC
  Administered 2024-01-15 (×2): 30 mg via INTRAMUSCULAR
  Filled 2024-01-15: qty 1

## 2024-01-15 MED ORDER — CELECOXIB 200 MG PO CAPS
200.0000 mg | ORAL_CAPSULE | Freq: Two times a day (BID) | ORAL | 0 refills | Status: AC | PRN
  Filled 2024-01-15: qty 30, 15d supply, fill #0
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
## 2024-01-15 NOTE — ED Triage Notes (Signed)
 Pt states pain in right leg that started 2 days ago  Denies any injury  States starts at ankle and goes up

## 2024-01-15 NOTE — Discharge Instructions (Signed)
 As discussed, your x-ray appeared normal.  Will send in medicines use as needed for pain.  Recommend follow-up with orthopedics for further assessment regarding your right knee pain as there is concern for meniscal or ligament injury.

## 2024-04-07 ENCOUNTER — Encounter: Payer: Self-pay | Admitting: Radiology

## 2024-06-11 ENCOUNTER — Encounter (HOSPITAL_BASED_OUTPATIENT_CLINIC_OR_DEPARTMENT_OTHER): Payer: Self-pay

## 2024-06-11 ENCOUNTER — Other Ambulatory Visit: Payer: Self-pay

## 2024-06-11 ENCOUNTER — Emergency Department (HOSPITAL_BASED_OUTPATIENT_CLINIC_OR_DEPARTMENT_OTHER)

## 2024-06-11 DIAGNOSIS — W07XXXA Fall from chair, initial encounter: Secondary | ICD-10-CM | POA: Insufficient documentation

## 2024-06-11 DIAGNOSIS — M25531 Pain in right wrist: Secondary | ICD-10-CM | POA: Diagnosis not present

## 2024-06-11 DIAGNOSIS — Z7982 Long term (current) use of aspirin: Secondary | ICD-10-CM | POA: Diagnosis not present

## 2024-06-11 DIAGNOSIS — S40021A Contusion of right upper arm, initial encounter: Secondary | ICD-10-CM | POA: Diagnosis not present

## 2024-06-11 DIAGNOSIS — S4991XA Unspecified injury of right shoulder and upper arm, initial encounter: Secondary | ICD-10-CM | POA: Diagnosis present
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
## 2024-06-12 ENCOUNTER — Emergency Department (HOSPITAL_BASED_OUTPATIENT_CLINIC_OR_DEPARTMENT_OTHER)
Admission: EM | Admit: 2024-06-12 | Discharge: 2024-06-12 | Disposition: A | Discharge status: Home / Self Care | Attending: Emergency Medicine | Admitting: Emergency Medicine

## 2024-06-12 DIAGNOSIS — S40021A Contusion of right upper arm, initial encounter: Secondary | ICD-10-CM

## 2024-06-12 MED ORDER — HYDROCODONE-ACETAMINOPHEN 5-325 MG PO TABS
1.0000 | ORAL_TABLET | Freq: Once | ORAL | Status: AC
  Administered 2024-06-12: 1 via ORAL
  Filled 2024-06-12: qty 1
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
